# Patient Record
Sex: Male | Born: 1965
Health system: Southern US, Community
[De-identification: ages and names within clinical notes are randomized; demographics above are authoritative.]

## PROBLEM LIST (undated history)

## (undated) DIAGNOSIS — J302 Other seasonal allergic rhinitis: Secondary | ICD-10-CM

## (undated) DIAGNOSIS — R519 Headache, unspecified: Secondary | ICD-10-CM

## (undated) DIAGNOSIS — I10 Essential (primary) hypertension: Secondary | ICD-10-CM

## (undated) DIAGNOSIS — R51 Headache: Secondary | ICD-10-CM

## (undated) DIAGNOSIS — K222 Esophageal obstruction: Secondary | ICD-10-CM

## (undated) DIAGNOSIS — K219 Gastro-esophageal reflux disease without esophagitis: Secondary | ICD-10-CM

## (undated) DIAGNOSIS — Z8719 Personal history of other diseases of the digestive system: Secondary | ICD-10-CM

## (undated) DIAGNOSIS — E781 Pure hyperglyceridemia: Secondary | ICD-10-CM

## (undated) HISTORY — PX: KNEE ARTHROSCOPY: SHX127

## (undated) HISTORY — PX: UPPER GI ENDOSCOPY: SHX6162

---

## 2012-08-09 ENCOUNTER — Ambulatory Visit (HOSPITAL_COMMUNITY)
Admission: RE | Admit: 2012-08-09 | Discharge: 2012-08-09 | Disposition: A | Discharge status: Home / Self Care | Payer: 59 | Source: Ambulatory Visit | Attending: Internal Medicine | Admitting: Internal Medicine

## 2012-08-09 ENCOUNTER — Encounter (HOSPITAL_COMMUNITY): Payer: Self-pay

## 2012-08-09 VITALS — BP 164/98 | HR 74 | Ht 76.0 in | Wt 286.0 lb

## 2012-08-09 DIAGNOSIS — I10 Essential (primary) hypertension: Secondary | ICD-10-CM | POA: Insufficient documentation

## 2012-08-09 HISTORY — DX: Essential (primary) hypertension: I10

## 2012-08-09 HISTORY — DX: Gastro-esophageal reflux disease without esophagitis: K21.9

## 2012-08-09 HISTORY — DX: Esophageal obstruction: K22.2

## 2012-08-09 HISTORY — DX: Pure hyperglyceridemia: E78.1

## 2012-08-09 LAB — COMPREHENSIVE METABOLIC PANEL
ALT: 22 U/L (ref 0–53)
CO2: 28 mEq/L (ref 19–32)
Calcium: 9.8 mg/dL (ref 8.4–10.5)
Creatinine, Ser: 1.3 mg/dL (ref 0.50–1.35)
GFR calc Af Amer: 74 mL/min — ABNORMAL LOW (ref >90)
GFR calc non Af Amer: 64 mL/min — ABNORMAL LOW (ref >90)
Glucose, Bld: 96 mg/dL (ref 70–99)

## 2012-08-09 MED ORDER — DOXAZOSIN MESYLATE 2 MG PO TABS: 2.0000 mg | ORAL_TABLET | Freq: Every day | ORAL | Status: DC

## 2012-08-09 MED ORDER — CLONIDINE HCL 0.2 MG PO TABS: 0.1000 mg | ORAL_TABLET | Freq: Two times a day (BID) | ORAL | Status: DC

## 2012-08-09 NOTE — Patient Instructions (Addendum)
Follow up in 2 months.  Cut clonidine back to 0.1 mg.  Start cardura 0.2 mg daily at [facility].  Will call with lab results and will set up stress test.

## 2012-08-09 NOTE — Progress Notes (Signed)
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 Presents for consultation regarding his HTN.   First diagnosed with HTN in college when he went to donate blood. Started therapy when he was 28. Was in amlodipine trial. But had severe edema and had to stop.  Family history is not notable for severe HTN. Has been on multiple medications recently (see below)   Does not exercise routinely but remains very active. No CP or SOB.   Recently BPs running 120-145/80s. However he gets very fatigued; especially with clonidine. Only able to get HR into 80s with activity. Only snores when he is post-call and up for a long time. Otherwise, no snoring or other signs of OSA.   Has never had stress test or renal u/s. Cr 1.1-1.4    Review of Systems: [y] = yes, [ ]  = no   General: Weight gain [ ] ; Weight loss [ ] ; Anorexia [ ] ; Fatigue [ y]; Fever [ ] ; Chills [ ] ; Weakness [ ]   Cardiac: Chest pain/pressure [ ] ; Resting SOB [ ] ; Exertional SOB [ ] ; Orthopnea [ ] ; Pedal Edema [ ] ; Palpitations [ ] ; Syncope [ ] ; Presyncope [ ] ; Paroxysmal nocturnal dyspnea[ ]   Pulmonary: Cough [ ] ; Wheezing[ ] ; Hemoptysis[ ] ; Sputum [ ] ; Snoring [ ]   GI: Vomiting[ ] ; Dysphagia[ ] ; Melena[ ] ; Hematochezia [ ] ; Heartburn[ ] ; Abdominal pain [ ] ; Constipation [ ] ; Diarrhea [ ] ; BRBPR [ ]   GU: Hematuria[ ] ; Dysuria [ ] ; Nocturia[ ]   Vascular: Pain in legs with walking [ ] ; Pain in feet with lying flat [ ] ; Non-healing sores [ ] ; Stroke [ ] ; TIA [ ] ; Slurred speech [ ] ;  Neuro: Headaches[ ] ; Vertigo[ ] ; Seizures[ ] ; Paresthesias[ ] ;Blurred vision [ ] ; Diplopia [ ] ; Vision changes [ ]   Ortho/Skin: Arthritis [ ] ; Joint pain [ ] ; Muscle pain [ ] ; Joint swelling [ ] ; Back Pain [ ] ; Rash [ ]   Psych: Depression[ ] ; Anxiety[ ]   Heme: Bleeding problems [ ] ; Clotting disorders [ ] ;  Anemia [ ]   Endocrine: Diabetes [ ] ; Thyroid dysfunction[ ]   Home Medications  Past Medical History: Past Medical History  Diagnosis Date  . Hypertension   . Schatzki's ring   . Hypertriglyceridemia   . GERD (gastroesophageal reflux disease)    Family History:  Dad: Type II DM, diet controlled. Mild HTN Mom: Deceased. Cerebral aneursym  Social History: History   Social History  . Marital Status: Married    Spouse Name: N/A    Number of Children: N/A  . Years of Education: N/A   Social History Main Topics  . Smoking status: Never Smoker   . Smokeless tobacco: Not on file  . Alcohol Use: Not on file  . Drug Use: Not on file  . Sexually Active: Not on file   Other Topics Concern  . Not on file   Social History Narrative  . No narrative on file    Allergies:  Allergies  Allergen Reactions  . Lopid (Gemfibrozil) Diarrhea  . Norvasc (Amlodipine Besylate) Swelling    Objective:    Vital Signs:   Pulse Rate:  [74] 74 (02/20 1023) BP: (164)/(98) 164/98 mmHg (02/20 1023) SpO2:  [96 %] 96 % (02/20 1023) Weight:  [286 lb (129.729 kg)] 286 lb (129.729 kg) (02/20 1023)    Weight change:  Filed Weights   08/09/12 1023  Weight: 286 lb (129.729 kg)     Physical Exam: General:  Well appearing. No resp difficulty HEENT: normal Neck: supple. JVP . Carotids 2+ bilat; no bruits. No lymphadenopathy or thryomegaly appreciated. Cor: PMI nondisplaced. Regular rate & rhythm. No rubs, gallops or murmurs. Lungs: clear Abdomen: soft, nontender, nondistended. No hepatosplenomegaly. No bruits or masses. Good bowel sounds. Extremities: no cyanosis, clubbing, rash, edema Neuro: alert & orientedx3, cranial nerves grossly intact. moves all 4 extremities w/o difficulty. Affect pleasant    ASSESSMENT & PLAN

## 2012-08-14 ENCOUNTER — Telehealth (HOSPITAL_COMMUNITY): Payer: Self-pay | Admitting: Anesthesiology

## 2012-08-14 MED ORDER — POTASSIUM CHLORIDE CRYS ER 20 MEQ PO TBCR: 40.0000 meq | EXTENDED_RELEASE_TABLET | Freq: Two times a day (BID) | ORAL | Status: DC

## 2012-08-14 NOTE — Telephone Encounter (Signed)
Called patient and clarified K+ dose. Asked patient to change to BID.

## 2012-08-15 LAB — ALDOSTERONE + RENIN ACTIVITY W/ RATIO
ALDO / PRA Ratio: 27.3 Ratio (ref 0.9–28.9)
Aldosterone: 3 ng/dL
PRA LC/MS/MS: 0.11 ng/mL/h — ABNORMAL LOW (ref 0.25–5.82)

## 2012-08-19 DIAGNOSIS — I10 Essential (primary) hypertension: Secondary | ICD-10-CM | POA: Insufficient documentation

## 2012-08-19 NOTE — Assessment & Plan Note (Signed)
I suspect he has severe essential HTN. I do not see any clear secondary causes. He does not appear to have OSA. Will check some basic labs including PRA activity. Given fatigue will cut clonidine back to 0.1 mg bid.  Start cardura 0.2 mg daily at [facility]. Will titrate as needed. Needs stress test for routine screening given risk factors.

## 2012-08-31 ENCOUNTER — Ambulatory Visit (INDEPENDENT_AMBULATORY_CARE_PROVIDER_SITE_OTHER): Payer: 59 | Admitting: Physician Assistant

## 2012-08-31 DIAGNOSIS — I10 Essential (primary) hypertension: Secondary | ICD-10-CM

## 2012-08-31 NOTE — Procedures (Signed)
Exercise Treadmill Test  Pre-Exercise Testing Evaluation Rhythm: normal sinus  Rate: 55     Test  Exercise Tolerance Test Ordering MD: Arvilla Meres, MD  Interpreting MD: Tereso Newcomer PA-C  Unique Test No: 1  Treadmill:  1  Indication for ETT: chest pain - rule out ischemia  Contraindication to ETT: No   Stress Modality: exercise - treadmill  Cardiac Imaging Performed: non   Protocol: standard Bruce - maximal  Max BP:  210/97  Max MPHR (bpm):  172 85% MPR (bpm):  146  MPHR obtained (bpm):  155 % MPHR obtained:  90%  Reached 85% MPHR (min:sec):  7:50 Total Exercise Time (min-sec):  9:01  Workload in METS:  10.6 Borg Scale: 18  Reason ETT Terminated:  patient's desire to stop    ST Segment Analysis At Rest: normal ST segments - no evidence of significant ST depression With Exercise: non-specific ST changes  Other Information Arrhythmia:  No Angina during ETT:  absent (0) Quality of ETT:  diagnostic  ETT Interpretation:  normal - no evidence of ischemia by ST analysis  Comments: Good exercise tolerance. No chest pain. Normal BP response to exercise. Insignificant up-sloping ST depression noted at max exercise.     Recommendations: Follow up with Dr. Arvilla Meres as directed. Luna Glasgow, PA-C  8:55 AM 08/31/2012

## 2012-10-15 ENCOUNTER — Ambulatory Visit (HOSPITAL_COMMUNITY)
Admission: RE | Admit: 2012-10-15 | Discharge: 2012-10-15 | Disposition: A | Discharge status: Home / Self Care | Payer: 59 | Source: Ambulatory Visit | Attending: Internal Medicine | Admitting: Internal Medicine

## 2012-10-15 ENCOUNTER — Encounter (HOSPITAL_COMMUNITY): Payer: Self-pay

## 2012-10-15 VITALS — BP 162/94 | HR 76 | Wt 287.1 lb

## 2012-10-15 DIAGNOSIS — I10 Essential (primary) hypertension: Secondary | ICD-10-CM | POA: Insufficient documentation

## 2012-10-15 MED ORDER — DOXAZOSIN MESYLATE 2 MG PO TABS: 8.0000 mg | ORAL_TABLET | Freq: Every day | ORAL | Status: DC

## 2012-10-15 NOTE — Patient Instructions (Addendum)
Increase cardura 6 mg daily.  Would like BP less than 140/90.  If blood pressure remains high would stop lisinopril/HCTZ and potassium and start spironolactone 50 mg daily.   Follow up 3 months with Dr. Gala Romney

## 2012-10-15 NOTE — Progress Notes (Signed)
  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   First diagnosed with HTN in college when he went to donate blood. Started therapy when he was 28. Was in amlodipine trial. But had severe edema and had to stop. Family history is not notable for severe HTN. Has been on multiple medications recently (see below)   08/31/12: Stress test normal  Cr 1.3 LDL 102  Returns for follow up today.  Feels well.  BPs 140-160/60-90.  No chest pain.  No shortness of breath.  Clonidine stopped due to fatigue, feels much better.  Not exercising.     Review of Systems: All pertinent positives and negatives as in HPI, otherwise negative  Home Medications Current Outpatient Prescriptions on File Prior to Encounter  Medication Sig Dispense Refill  . aspirin 81 MG tablet Take 81 mg by mouth daily.      . carvedilol (COREG) 25 MG tablet Take 25 mg by mouth 2 (two) times daily with a meal.      . esomeprazole (NEXIUM) 40 MG capsule Take 40 mg by mouth daily before breakfast.      . fenofibrate 160 MG tablet Take 160 mg by mouth daily.      . hydrochlorothiazide (HYDRODIURIL) 25 MG tablet Take 25 mg by mouth daily.      Marland Kitchen lisinopril-hydrochlorothiazide (PRINZIDE,ZESTORETIC) 20-25 MG per tablet Take 1 tablet by mouth daily.      . Multiple Vitamin (MULTIVITAMIN) tablet Take 1 tablet by mouth daily.      . potassium chloride SA (K-DUR,KLOR-CON) 20 MEQ tablet Take 2 tablets (40 mEq total) by mouth 2 (two) times daily.  60 tablet  3  . valsartan (DIOVAN) 320 MG tablet Take 320 mg by mouth daily.       No current facility-administered medications on file prior to encounter.  Cardura 6 mg daily.   Past Medical History: Past Medical History  Diagnosis Date  . Hypertension   . Schatzki's ring   . Hypertriglyceridemia   . GERD (gastroesophageal reflux  disease)     Objective:    Vital Signs:  Filed Vitals:   10/15/12 0900  BP: 162/94  Pulse: 76  Weight: 287 lb 1.9 oz (130.237 kg)  SpO2: 97%     Physical Exam: General:  Well appearing. No resp difficulty HEENT: normal Neck: supple. JVP 5-6. Carotids 2+ bilat; no bruits. No lymphadenopathy or thryomegaly appreciated. Cor: PMI nondisplaced. Regular rate & rhythm. No rubs, gallops or murmurs. Lungs: clear Abdomen: soft, nontender, nondistended. No hepatosplenomegaly. No bruits or masses. Good bowel sounds. Extremities: no cyanosis, clubbing, rash, tr edema Neuro: alert & orientedx3, cranial nerves grossly intact. moves all 4 extremities w/o difficulty. Affect pleasant    ASSESSMENT & PLAN

## 2012-10-15 NOTE — Assessment & Plan Note (Addendum)
Blood pressure remains elevated.  Will increase cardura 8 mg daily.  Can use clonidine prn for SBP >150s.  Would like SBP<140, if not being controlled with increased cardura will plan to stop lisinopril/HCT and potassium and start spironolactone.  He will call next week with BP readings.  Patient seen and examined with Ulyess Blossom, PA-C. We discussed all aspects of the encounter. I agree with the assessment and plan as stated above. Agree with changes in BP meds. I would like to switch him from lisinopril/HCTZ to spiro at next visit. He will follow BPs at home and let me know how he is doing. Fatigue much improved off clonidine.

## 2012-10-16 ENCOUNTER — Other Ambulatory Visit (HOSPITAL_COMMUNITY): Payer: Self-pay | Admitting: *Deleted

## 2012-10-16 MED ORDER — POTASSIUM CHLORIDE CRYS ER 20 MEQ PO TBCR: 40.0000 meq | EXTENDED_RELEASE_TABLET | Freq: Two times a day (BID) | ORAL | Status: DC

## 2012-11-18 ENCOUNTER — Other Ambulatory Visit (HOSPITAL_COMMUNITY): Payer: Self-pay | Admitting: Physician Assistant

## 2012-11-19 MED ORDER — DOXAZOSIN MESYLATE 2 MG PO TABS: 8.0000 mg | ORAL_TABLET | Freq: Every day | ORAL | Status: DC

## 2013-01-01 ENCOUNTER — Ambulatory Visit (HOSPITAL_COMMUNITY): Payer: 59

## 2013-01-07 ENCOUNTER — Other Ambulatory Visit (HOSPITAL_COMMUNITY): Payer: Self-pay | Admitting: *Deleted

## 2013-01-07 MED ORDER — DOXAZOSIN MESYLATE 8 MG PO TABS: 8.0000 mg | ORAL_TABLET | Freq: Every day | ORAL | Status: DC

## 2013-01-07 MED ORDER — SPIRONOLACTONE 50 MG PO TABS: 50.0000 mg | ORAL_TABLET | Freq: Every day | ORAL | Status: DC

## 2013-01-22 ENCOUNTER — Ambulatory Visit (HOSPITAL_COMMUNITY)
Admission: RE | Admit: 2013-01-22 | Discharge: 2013-01-22 | Disposition: A | Discharge status: Home / Self Care | Payer: 59 | Source: Ambulatory Visit | Attending: Internal Medicine | Admitting: Internal Medicine

## 2013-01-22 ENCOUNTER — Encounter (HOSPITAL_COMMUNITY): Payer: Self-pay

## 2013-01-22 VITALS — BP 160/108 | HR 64 | Wt 287.1 lb

## 2013-01-22 DIAGNOSIS — I10 Essential (primary) hypertension: Secondary | ICD-10-CM | POA: Insufficient documentation

## 2013-01-22 DIAGNOSIS — E785 Hyperlipidemia, unspecified: Secondary | ICD-10-CM | POA: Insufficient documentation

## 2013-01-22 LAB — BASIC METABOLIC PANEL
BUN: 24 mg/dL — ABNORMAL HIGH (ref 6–23)
CO2: 27 mEq/L (ref 19–32)
Calcium: 9.7 mg/dL (ref 8.4–10.5)
Creatinine, Ser: 1.27 mg/dL (ref 0.50–1.35)

## 2013-01-22 NOTE — Progress Notes (Addendum)
Patient ID: Albert Leach, male   DOB: 10-04-1965, 47 y.o.   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   First diagnosed with HTN in college when he went to donate blood. Started therapy when he was 28. Was in amlodipine trial. But had severe edema and had to stop. Family history is not notable for severe HTN. Has been on multiple medications recently (see below)   08/31/12: Stress test normal  Cr 1.3 LDL 102  Follow up. Last visit increased your Cardura to 8mg  daily, started spironolactone 50 mg daily, stopped HCTZ and potassium. Reports feeling well. Daughter getting ready to go to college. BPs at home 120-140/70's. Not exercising. Has not taken morning medications and just came from HR.   Review of Systems: All pertinent positives and negatives as in HPI, otherwise negative  Home Medications Current Outpatient Prescriptions on File Prior to Encounter  Medication Sig Dispense Refill  . aspirin 81 MG tablet Take 81 mg by mouth daily.      . carvedilol (COREG) 25 MG tablet Take 25 mg by mouth 2 (two) times daily with a meal.      . doxazosin (CARDURA) 8 MG tablet Take 1 tablet (8 mg total) by mouth at bedtime.  90 tablet  3  . esomeprazole (NEXIUM) 40 MG capsule Take 40 mg by mouth daily before breakfast.      . fenofibrate 160 MG tablet Take 160 mg by mouth daily.      . Multiple Vitamin (MULTIVITAMIN) tablet Take 1 tablet by mouth daily.      Marland Kitchen spironolactone (ALDACTONE) 50 MG tablet Take 1 tablet (50 mg total) by mouth daily.  90 tablet  3  . valsartan (DIOVAN) 320 MG tablet Take 320 mg by mouth daily.       No current facility-administered medications on file prior to encounter.   Past Medical History: Past Medical History  Diagnosis Date  . Hypertension   . Schatzki's ring   .  Hypertriglyceridemia   . GERD (gastroesophageal reflux disease)     Objective:    Vital Signs:  Filed Vitals:   01/22/13 0905  BP: 160/108  Pulse: 64  Weight: 287 lb 1.9 oz (130.237 kg)  SpO2: 98%   Physical Exam: General:  Well appearing. No resp difficulty HEENT: normal Neck: supple. JVP 5-6. Carotids 2+ bilat; no bruits. No lymphadenopathy or thryomegaly appreciated. Cor: PMI nondisplaced. Regular rate & rhythm. No rubs, gallops or murmurs. Lungs: clear Abdomen: soft, nontender, nondistended. No hepatosplenomegaly. No bruits or masses. Good bowel sounds. Extremities: no cyanosis, clubbing, rash, tr edema Neuro: alert & orientedx3, cranial nerves grossly intact. moves all 4 extremities w/o difficulty. Affect pleasant   ASSESSMENT & PLAN  1) HTN - BP elevated this am, however did not take am doses of medications - Will continue BB, ARB, Spiro, and cardura - Continue to monitor BP and if remains elevated call back to clinic - BMET today, stable - F/U 6 months  2) Hyperlipidemia - Managed by Dr. Reather Leach - Continue ASA and fenofibrate  Albert Leach B NP-C 11:32 AM

## 2013-01-22 NOTE — Patient Instructions (Addendum)
Doing great.  Will get BMET today.  Call if notice any increase in BP or notice that it is no longer controlled.  F/U 6 months

## 2013-04-25 ENCOUNTER — Other Ambulatory Visit: Payer: Self-pay

## 2013-08-29 ENCOUNTER — Telehealth (HOSPITAL_COMMUNITY): Payer: Self-pay | Admitting: Cardiology

## 2013-08-29 ENCOUNTER — Encounter (HOSPITAL_COMMUNITY): Payer: Self-pay | Admitting: Cardiology

## 2013-08-29 NOTE — Telephone Encounter (Signed)
Attempting to schedule 6 month follow up I have been unable to reach this patient by phone.  A letter is being sent to the last known home address.

## 2013-09-23 ENCOUNTER — Ambulatory Visit (HOSPITAL_COMMUNITY)
Admission: RE | Admit: 2013-09-23 | Discharge: 2013-09-23 | Disposition: A | Discharge status: Home / Self Care | Payer: 59 | Source: Ambulatory Visit | Attending: Internal Medicine | Admitting: Internal Medicine

## 2013-09-23 VITALS — BP 148/88 | HR 66 | Wt 291.5 lb

## 2013-09-23 DIAGNOSIS — I10 Essential (primary) hypertension: Secondary | ICD-10-CM | POA: Insufficient documentation

## 2013-09-23 MED ORDER — DOXAZOSIN MESYLATE 8 MG PO TABS: ORAL_TABLET | ORAL | Status: DC

## 2013-09-23 NOTE — Addendum Note (Signed)
Encounter addended by: Scarlette Calico, RN on: 09/23/2013  8:56 AM<BR>     Documentation filed: Patient Instructions Section, Orders

## 2013-09-23 NOTE — Progress Notes (Signed)
Patient ID: Albert Leach, male   DOB: Aug 24, 1965, 48 y.o.   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   First diagnosed with HTN in college when he went to donate blood. Started therapy when he was 63. Was in amlodipine trial but had severe edema and had to stop. Family history is not notable for severe HTN. Has been on multiple medications recently (see below)   08/31/12: Stress test normal  Cr 1.3 LDL 102  Follow up. Last visit no changes. Doing well. Has f/u with PCP for labs and yearly physical later this month. SBP at thome 126-160/70-90s. Systolics average about 989. Several diastolics in the 21J. Denies CP or SOB. Not exercising but relatively active. No CP/SOB. Took Coreg this am, but not other meds just got off work.  Review of Systems: All pertinent positives and negatives as in HPI, otherwise negative  Home Medications Current Outpatient Prescriptions on File Prior to Encounter  Medication Sig Dispense Refill  . aspirin 81 MG tablet Take 81 mg by mouth daily.      . carvedilol (COREG) 25 MG tablet Take 25 mg by mouth 2 (two) times daily with a meal.      . doxazosin (CARDURA) 8 MG tablet Take 1 tablet (8 mg total) by mouth at bedtime.  90 tablet  3  . fenofibrate 160 MG tablet Take 160 mg by mouth daily.      . Multiple Vitamin (MULTIVITAMIN) tablet Take 1 tablet by mouth daily.      Marland Kitchen spironolactone (ALDACTONE) 50 MG tablet Take 1 tablet (50 mg total) by mouth daily.  90 tablet  3  . valsartan (DIOVAN) 320 MG tablet Take 320 mg by mouth daily.       No current facility-administered medications on file prior to encounter.   Past Medical History: Past Medical History  Diagnosis Date  . Hypertension   . Schatzki's ring   . Hypertriglyceridemia   . GERD (gastroesophageal reflux disease)      Objective:    Vital Signs:  Filed Vitals:   09/23/13 0810  BP: 148/88  Pulse: 66  Weight: 291 lb 8 oz (132.224 kg)  SpO2: 96%   Physical Exam: General:  Well appearing. No resp difficulty HEENT: normal Neck: supple. JVP 5-6. Carotids 2+ bilat; no bruits. No lymphadenopathy or thryomegaly appreciated. Cor: PMI nondisplaced. Regular rate & rhythm. No rubs, gallops or murmurs. Lungs: clear Abdomen: soft, nontender, nondistended. No hepatosplenomegaly. No bruits or masses. Good bowel sounds. Extremities: no cyanosis, clubbing, rash, tr edema Neuro: alert & orientedx3, cranial nerves grossly intact. moves all 4 extremities w/o difficulty. Affect pleasant   ASSESSMENT & PLAN  1) HTN - BP better but still a bit too high.  - Will continue BB, ARB, Spiro, and cardura - Increase Cardura to 4am and 8pm - Continue to monitor BP and if remains elevated call back to clinic - BMET today, stable - F/U 6 months  2) Hyperlipidemia - Managed by Dr. Edson Snowball - Continue ASA and fenofibrate  Junie Bame B NP-C 8:16 AM  Patient seen and examined with Junie Bame, NP. We discussed all aspects of the encounter. I agree with the assessment and plan as stated above. Will increase Cardura and see if he tolerates. Can consider 24-hour BP monitor if needed. Stressed need  to keep weight down.   Albert Carias,MD 8:53 AM

## 2013-09-23 NOTE — Patient Instructions (Signed)
Increase Cardura to 4 mg (1/2 tab) in AM and 8 mg (1 tab) in PM  We will contact you in 6 months to schedule your next appointment.

## 2013-12-27 ENCOUNTER — Other Ambulatory Visit (HOSPITAL_COMMUNITY): Payer: Self-pay | Admitting: Internal Medicine

## 2013-12-27 DIAGNOSIS — I509 Heart failure, unspecified: Secondary | ICD-10-CM

## 2014-09-23 ENCOUNTER — Other Ambulatory Visit (HOSPITAL_COMMUNITY): Payer: Self-pay | Admitting: *Deleted

## 2014-09-29 ENCOUNTER — Other Ambulatory Visit (HOSPITAL_COMMUNITY): Payer: Self-pay | Admitting: Internal Medicine

## 2014-10-21 ENCOUNTER — Ambulatory Visit (HOSPITAL_COMMUNITY)
Admission: RE | Admit: 2014-10-21 | Discharge: 2014-10-21 | Disposition: A | Discharge status: Home / Self Care | Payer: 59 | Source: Ambulatory Visit | Attending: Internal Medicine | Admitting: Internal Medicine

## 2014-10-21 ENCOUNTER — Encounter (HOSPITAL_COMMUNITY): Payer: Self-pay

## 2014-10-21 VITALS — BP 114/64 | HR 62 | Wt 298.5 lb

## 2014-10-21 DIAGNOSIS — K219 Gastro-esophageal reflux disease without esophagitis: Secondary | ICD-10-CM | POA: Diagnosis not present

## 2014-10-21 DIAGNOSIS — E781 Pure hyperglyceridemia: Secondary | ICD-10-CM | POA: Diagnosis not present

## 2014-10-21 DIAGNOSIS — E785 Hyperlipidemia, unspecified: Secondary | ICD-10-CM

## 2014-10-21 DIAGNOSIS — Z7982 Long term (current) use of aspirin: Secondary | ICD-10-CM | POA: Diagnosis not present

## 2014-10-21 DIAGNOSIS — I10 Essential (primary) hypertension: Secondary | ICD-10-CM

## 2014-10-21 NOTE — Progress Notes (Signed)
Patient ID: MICHALL NOFFKE, male   DOB: 02/29/1966, 49 y.o.   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   First diagnosed with HTN in college when he went to donate blood. Started therapy when he was 43. Was in amlodipine trial but had severe edema and had to stop. Family history is not notable for severe HTN. Has been on multiple medications recently (see below)   Follow up. Last visit cardura was increased. Working outside a lot. Denies SOB. SBP 120-140s. Better controlled. Continue to work nights for [employer]. .  08/31/12: Stress test normal   LABS  10/06/2014 K 4.0 Creatinine 1.2 LDL 95 HDL 52 TGL 96.   Review of Systems: All pertinent positives and negatives as in HPI, otherwise negative  Home Medications Current Outpatient Prescriptions on File Prior to Encounter  Medication Sig Dispense Refill  . aspirin 81 MG tablet Take 81 mg by mouth daily.    . carvedilol (COREG) 25 MG tablet Take 25 mg by mouth 2 (two) times daily with a meal.    . doxazosin (CARDURA) 8 MG tablet Take 1/2 tab in AM and 1 tab in PM 135 tablet 3  . fenofibrate 160 MG tablet Take 160 mg by mouth daily.    . Multiple Vitamin (MULTIVITAMIN) tablet Take 1 tablet by mouth daily.    . pantoprazole (PROTONIX) 40 MG tablet Take 40 mg by mouth daily.    Marland Kitchen spironolactone (ALDACTONE) 50 MG tablet TAKE 1 TABLET BY MOUTH DAILY 90 tablet 3  . valsartan (DIOVAN) 320 MG tablet Take 320 mg by mouth daily.     No current facility-administered medications on file prior to encounter.   Past Medical History: Past Medical History  Diagnosis Date  . Hypertension   . Schatzki's ring   . Hypertriglyceridemia   . GERD (gastroesophageal reflux disease)     Objective:    Vital Signs:  Filed Vitals:   10/21/14 1017  BP: 114/64  Pulse: 62  Weight: 298  lb 8 oz (135.399 kg)  SpO2: 98%   Physical Exam: General:  Well appearing. No resp difficulty HEENT: normal Neck: supple. JVP 5-6. Carotids 2+ bilat; no bruits. No lymphadenopathy or thryomegaly appreciated. Cor: PMI nondisplaced. Regular rate & rhythm. No rubs, gallops or murmurs. Lungs: clear Abdomen: soft, nontender, nondistended. No hepatosplenomegaly. No bruits or masses. Good bowel sounds. Extremities: no cyanosis, clubbing, rash, tr edema Neuro: alert & orientedx3, cranial nerves grossly intact. moves all 4 extremities w/o difficulty. Affect pleasant   ASSESSMENT & PLAN  1) HTN - BP stable and much improved.   - Will continue current dose of BB, ARB, Spiro, and cardura  2) Hyperlipidemia - Managed by Dr. Edson Snowball - Continue ASA and fenofibrate  Follow up in 6 months   Babatunde Seago NP-C 10:23 AM

## 2014-10-21 NOTE — Patient Instructions (Signed)
Doing great!  Follow up 6 months.  Do the following things EVERYDAY: 1) Weigh yourself in the morning before breakfast. Write it down and keep it in a log. 2) Take your medicines as prescribed 3) Eat low salt foods-Limit salt (sodium) to 2000 mg per day.  4) Stay as active as you can everyday 5) Limit all fluids for the day to less than 2 liters

## 2014-12-31 ENCOUNTER — Other Ambulatory Visit (HOSPITAL_COMMUNITY): Payer: Self-pay | Admitting: Cardiology

## 2015-06-26 MED FILL — PANTOPRAZOLE SOD DR 40 MG T: 40 | 90 days supply | Qty: 90 | Fill #3

## 2015-06-26 MED FILL — DOXAZOSIN MESYLATE 8 MG TAB: 8 | 90 days supply | Qty: 135 | Fill #3

## 2015-06-26 MED FILL — CARVEDILOL 25 MG TABLET: 25 | 90 days supply | Qty: 180 | Fill #3

## 2015-06-26 MED FILL — SPIRONOLACTONE 50 MG TABLET: 50 | 90 days supply | Qty: 90 | Fill #2

## 2015-06-26 MED FILL — VALSARTAN 320 MG TABLET: 320 | 90 days supply | Qty: 90 | Fill #3

## 2015-06-26 MED FILL — FENOFIBRATE 160 MG TABLET: 160 | 90 days supply | Qty: 90 | Fill #2

## 2015-09-30 ENCOUNTER — Other Ambulatory Visit: Payer: Self-pay | Admitting: Internal Medicine

## 2015-09-30 MED FILL — VALSARTAN 320 MG TABLET: 320 | 90 days supply | Qty: 90 | Fill #0

## 2015-09-30 MED FILL — DOXAZOSIN MESYLATE 8 MG TAB: 8 | 90 days supply | Qty: 135 | Fill #0

## 2015-09-30 MED FILL — PANTOPRAZOLE SOD DR 40 MG T: 40 | 90 days supply | Qty: 90 | Fill #0

## 2015-09-30 MED FILL — FENOFIBRATE 160 MG TABLET: 160 | 90 days supply | Qty: 90 | Fill #3

## 2015-09-30 MED FILL — CARVEDILOL 25 MG TABLET: 25 | 90 days supply | Qty: 180 | Fill #0

## 2015-09-30 MED FILL — SPIRONOLACTONE 50 MG TABLET: 50 | 90 days supply | Qty: 90 | Fill #3

## 2015-10-19 DIAGNOSIS — Z125 Encounter for screening for malignant neoplasm of prostate: Secondary | ICD-10-CM | POA: Diagnosis not present

## 2015-10-19 DIAGNOSIS — Z0000 Encounter for general adult medical examination without abnormal findings: Secondary | ICD-10-CM | POA: Diagnosis not present

## 2015-10-20 DIAGNOSIS — E785 Hyperlipidemia, unspecified: Secondary | ICD-10-CM | POA: Diagnosis not present

## 2015-10-20 DIAGNOSIS — Z0000 Encounter for general adult medical examination without abnormal findings: Secondary | ICD-10-CM | POA: Diagnosis not present

## 2015-10-20 DIAGNOSIS — I129 Hypertensive chronic kidney disease with stage 1 through stage 4 chronic kidney disease, or unspecified chronic kidney disease: Secondary | ICD-10-CM | POA: Diagnosis not present

## 2015-12-30 ENCOUNTER — Other Ambulatory Visit (HOSPITAL_COMMUNITY): Payer: Self-pay | Admitting: Cardiology

## 2015-12-30 MED FILL — SPIRONOLACTONE 50 MG TABLET: 50 | 90 days supply | Qty: 90 | Fill #0

## 2015-12-30 MED FILL — DOXAZOSIN MESYLATE 8 MG TAB: 8 | 90 days supply | Qty: 135 | Fill #1

## 2016-01-01 MED FILL — VALSARTAN 320 MG TABLET: 320 | 90 days supply | Qty: 90 | Fill #0

## 2016-01-01 MED FILL — CARVEDILOL 25 MG TABLET: 25 | 90 days supply | Qty: 180 | Fill #0

## 2016-01-01 MED FILL — PANTOPRAZOLE SOD DR 40 MG T: 40 | 90 days supply | Qty: 90 | Fill #0

## 2016-01-01 MED FILL — FENOFIBRATE 160 MG TABLET: 160 | 90 days supply | Qty: 90 | Fill #0

## 2016-01-19 DIAGNOSIS — Z1211 Encounter for screening for malignant neoplasm of colon: Secondary | ICD-10-CM | POA: Diagnosis not present

## 2016-01-21 DIAGNOSIS — D649 Anemia, unspecified: Secondary | ICD-10-CM | POA: Diagnosis not present

## 2016-01-21 DIAGNOSIS — R195 Other fecal abnormalities: Secondary | ICD-10-CM | POA: Diagnosis not present

## 2016-01-21 DIAGNOSIS — Z8371 Family history of colonic polyps: Secondary | ICD-10-CM | POA: Diagnosis not present

## 2016-01-21 DIAGNOSIS — K219 Gastro-esophageal reflux disease without esophagitis: Secondary | ICD-10-CM | POA: Diagnosis not present

## 2016-02-08 MED FILL — GAVILYTE-N SOLUTION: 420 | 1 days supply | Qty: 4000 | Fill #0

## 2016-02-16 ENCOUNTER — Other Ambulatory Visit: Payer: Self-pay | Admitting: Gastroenterology

## 2016-02-18 ENCOUNTER — Encounter (HOSPITAL_COMMUNITY): Payer: Self-pay | Admitting: *Deleted

## 2016-02-24 ENCOUNTER — Encounter (HOSPITAL_COMMUNITY)
Admission: RE | Disposition: A | Discharge status: Home / Self Care | Payer: Self-pay | Source: Ambulatory Visit | Attending: Gastroenterology

## 2016-02-24 ENCOUNTER — Ambulatory Visit (HOSPITAL_COMMUNITY)
Admission: RE | Admit: 2016-02-24 | Discharge: 2016-02-24 | Disposition: A | Discharge status: Home / Self Care | Payer: 59 | Source: Ambulatory Visit | Attending: Gastroenterology | Admitting: Gastroenterology

## 2016-02-24 ENCOUNTER — Ambulatory Visit (HOSPITAL_COMMUNITY): Payer: 59 | Admitting: Anesthesiology

## 2016-02-24 ENCOUNTER — Encounter (HOSPITAL_COMMUNITY): Payer: Self-pay | Admitting: *Deleted

## 2016-02-24 DIAGNOSIS — K635 Polyp of colon: Secondary | ICD-10-CM | POA: Insufficient documentation

## 2016-02-24 DIAGNOSIS — D649 Anemia, unspecified: Secondary | ICD-10-CM | POA: Diagnosis not present

## 2016-02-24 DIAGNOSIS — Z6836 Body mass index (BMI) 36.0-36.9, adult: Secondary | ICD-10-CM | POA: Diagnosis not present

## 2016-02-24 DIAGNOSIS — K449 Diaphragmatic hernia without obstruction or gangrene: Secondary | ICD-10-CM | POA: Diagnosis not present

## 2016-02-24 DIAGNOSIS — Z8371 Family history of colonic polyps: Secondary | ICD-10-CM | POA: Insufficient documentation

## 2016-02-24 DIAGNOSIS — Z7982 Long term (current) use of aspirin: Secondary | ICD-10-CM | POA: Insufficient documentation

## 2016-02-24 DIAGNOSIS — E669 Obesity, unspecified: Secondary | ICD-10-CM | POA: Insufficient documentation

## 2016-02-24 DIAGNOSIS — D123 Benign neoplasm of transverse colon: Secondary | ICD-10-CM | POA: Diagnosis not present

## 2016-02-24 DIAGNOSIS — K644 Residual hemorrhoidal skin tags: Secondary | ICD-10-CM | POA: Insufficient documentation

## 2016-02-24 DIAGNOSIS — K317 Polyp of stomach and duodenum: Secondary | ICD-10-CM | POA: Diagnosis not present

## 2016-02-24 DIAGNOSIS — Z79899 Other long term (current) drug therapy: Secondary | ICD-10-CM | POA: Insufficient documentation

## 2016-02-24 DIAGNOSIS — R195 Other fecal abnormalities: Secondary | ICD-10-CM | POA: Diagnosis not present

## 2016-02-24 DIAGNOSIS — E781 Pure hyperglyceridemia: Secondary | ICD-10-CM | POA: Diagnosis not present

## 2016-02-24 DIAGNOSIS — D122 Benign neoplasm of ascending colon: Secondary | ICD-10-CM | POA: Insufficient documentation

## 2016-02-24 DIAGNOSIS — I10 Essential (primary) hypertension: Secondary | ICD-10-CM | POA: Insufficient documentation

## 2016-02-24 DIAGNOSIS — K219 Gastro-esophageal reflux disease without esophagitis: Secondary | ICD-10-CM | POA: Insufficient documentation

## 2016-02-24 DIAGNOSIS — D124 Benign neoplasm of descending colon: Secondary | ICD-10-CM | POA: Diagnosis not present

## 2016-02-24 HISTORY — DX: Other seasonal allergic rhinitis: J30.2

## 2016-02-24 HISTORY — DX: Personal history of other diseases of the digestive system: Z87.19

## 2016-02-24 HISTORY — PX: ESOPHAGOGASTRODUODENOSCOPY (EGD) WITH PROPOFOL: SHX5813

## 2016-02-24 HISTORY — PX: COLONOSCOPY WITH PROPOFOL: SHX5780

## 2016-02-24 HISTORY — DX: Headache: R51

## 2016-02-24 HISTORY — DX: Headache, unspecified: R51.9

## 2016-02-24 SURGERY — ESOPHAGOGASTRODUODENOSCOPY (EGD) WITH PROPOFOL
Anesthesia: Monitor Anesthesia Care

## 2016-02-24 MED ORDER — PROPOFOL 10 MG/ML IV BOLUS
INTRAVENOUS | Status: AC
  Filled 2016-02-24: qty 40

## 2016-02-24 MED ORDER — ASPIRIN 81 MG PO TABS: 81.0000 mg | ORAL_TABLET | Freq: Every day | ORAL | Status: AC

## 2016-02-24 MED ORDER — PROPOFOL 10 MG/ML IV BOLUS
INTRAVENOUS | Status: AC
Start: 2016-02-24 — End: 2016-02-24
  Filled 2016-02-24: qty 20

## 2016-02-24 MED ORDER — PROPOFOL 10 MG/ML IV BOLUS
INTRAVENOUS | Status: DC | PRN
  Administered 2016-02-24 (×7): 40 mg via INTRAVENOUS
  Administered 2016-02-24: 20 mg via INTRAVENOUS
  Administered 2016-02-24 (×4): 40 mg via INTRAVENOUS

## 2016-02-24 MED ORDER — LACTATED RINGERS IV SOLN
INTRAVENOUS | Status: DC
  Administered 2016-02-24: 08:00:00 via INTRAVENOUS
  Administered 2016-02-24: 1000 mL via INTRAVENOUS

## 2016-02-24 MED ORDER — SODIUM CHLORIDE 0.9 % IV SOLN
INTRAVENOUS | Status: DC
  Administered 2016-02-24: 09:00:00 via INTRAVENOUS

## 2016-02-24 SURGICAL SUPPLY — 24 items

## 2016-02-24 NOTE — H&P (Signed)
[facility] Admission Note  Chief Complaint: GERD, anemia, HPS  HPI: Albert Leach is an 50 y.o. male.  Presents for endoscopy and colonoscopy for GERD, hemoccult-positive stools, anemia.  Past Medical History:  Diagnosis Date  . GERD (gastroesophageal reflux disease)   . Headache    headache with hypertension med of "level 4"  . History of hiatal hernia   . Hypertension   . Hypertriglyceridemia   . Schatzki's ring   . Seasonal allergies     Past Surgical History:  Procedure Laterality Date  . KNEE ARTHROSCOPY Left   . UPPER GI ENDOSCOPY      Medications Prior to Admission  Medication Sig Dispense Refill  . aspirin 81 MG tablet Take 81 mg by mouth daily.    . carvedilol (COREG) 25 MG tablet Take 25 mg by mouth 2 (two) times daily with a meal.    . doxazosin (CARDURA) 8 MG tablet TAKE 1/2 TABLET BY MOUTH IN THE MORNING AND 1 TABLET IN THE EVENING 135 tablet 3  . fenofibrate 160 MG tablet Take 160 mg by mouth daily.    . Multiple Vitamin (MULTIVITAMIN) tablet Take 1 tablet by mouth daily.    . pantoprazole (PROTONIX) 40 MG tablet Take 40 mg by mouth daily.    Marland Kitchen spironolactone (ALDACTONE) 50 MG tablet TAKE 1 TABLET BY MOUTH DAILY 90 tablet 3  . valsartan (DIOVAN) 320 MG tablet Take 320 mg by mouth daily.      Allergies:  Allergies  Allergen Reactions  . Prednisone Hypertension    And 20 lbs weight gain in 2 weeks  . Lopid [Gemfibrozil] Diarrhea  . Norvasc [Amlodipine Besylate] Swelling    Lower extremity edema    Family History  Problem Relation Age of Onset  . Cerebral aneurysm Mother   . CAD Father     Social History:  reports that he has never smoked. He has never used smokeless tobacco. He reports that he drinks alcohol. He reports that he does not use drugs.   ROS: as per HPI  Blood pressure (!) 172/92, pulse (!) 57, temperature 98.1 F (36.7 C), temperature source Oral, resp. rate 10, height 6\' 4"  (1.93 m), weight 133.8 kg (295 lb), SpO2  100 %. General appearance:  NAD Head: [place]/AT Eyes: anicteric GI: Soft, non-tender Extremities: no edema Skin: no rash NEURO:  Non-focal  No results found for this or any previous visit (from the past 48 hour(s)). No results found.  Assessment/Plan  1.  GERD. 2.  Anemia. 3.  Hemoccult-positive stools. 4.  Family history of colonic polyps  Plan:  1.  Endoscopy. 2.  Risks (bleeding, infection, bowel perforation that could require surgery, sedation-related changes in cardiopulmonary systems), benefits (identification and possible treatment of source of symptoms, exclusion of certain causes of symptoms), and alternatives (watchful waiting, radiographic imaging studies, empiric medical treatment) of upper endoscopy (EGD) were explained to patient/family in detail and patient wishes to proceed. 3.  Colonoscopy. 4.  Risks (bleeding, infection, bowel perforation that could require surgery, sedation-related changes in cardiopulmonary systems), benefits (identification and possible treatment of source of symptoms, exclusion of certain causes of symptoms), and alternatives (watchful waiting, radiographic imaging studies, empiric medical treatment) of colonoscopy were explained to patient/family in detail and patient wishes to proceed.  Landry Dyke 02/24/2016, 8:19 AM

## 2016-02-24 NOTE — Discharge Instructions (Signed)

## 2016-02-24 NOTE — Op Note (Signed)
[facility] Patient Name: Albert Leach Procedure Date: 02/24/2016 MRN: [id number] Attending MD: Arta Silence , MD Date of Birth: [birth year] CSN: [id number] Age: 50 Admit Type: Outpatient Procedure:                Colonoscopy Indications:              This is the patient's first colonoscopy, Heme                            positive stool, Family history of colonic polyps in                            a first-degree relative Providers:                Arta Silence, MD, Alinda Deem, RN, Elspeth Cho Tech., Technician, Glenis Smoker, CRNA Referring MD:             Blenda Mounts, MD Medicines:                Monitored Anesthesia Care Complications:            No immediate complications. Estimated Blood Loss:     Estimated blood loss was minimal. Procedure:                Pre-Anesthesia Assessment:                           - Prior to the procedure, a History and Physical                            was performed, and patient medications and                            allergies were reviewed. The patient's tolerance of                            previous anesthesia was also reviewed. The risks                            and benefits of the procedure and the sedation                            options and risks were discussed with the patient.                            All questions were answered, and informed consent                            was obtained. Prior Anticoagulants: The patient has                            taken aspirin. ASA Grade Assessment: II - A patient  with mild systemic disease. After reviewing the                            risks and benefits, the patient was deemed in                            satisfactory condition to undergo the procedure.                           - Prior to the procedure, a History and Physical                            was performed, and patient medications and                             allergies were reviewed. The patient's tolerance of                            previous anesthesia was also reviewed. The risks                            and benefits of the procedure and the sedation                            options and risks were discussed with the patient.                            All questions were answered, and informed consent                            was obtained. Prior Anticoagulants: The patient has                            taken aspirin. ASA Grade Assessment: II - A patient                            with mild systemic disease. After reviewing the                            risks and benefits, the patient was deemed in                            satisfactory condition to undergo the procedure.                           After obtaining informed consent, the colonoscope                            was passed under direct vision. Throughout the                            procedure, the patient's blood pressure, pulse, and  oxygen saturations were monitored continuously. The                            [id number]) scope was introduced through                            the anus and advanced to the the cecum, identified                            by appendiceal orifice and ileocecal valve. The                            ileocecal valve, appendiceal orifice, and rectum                            were photographed. The entire colon was examined.                            The colonoscopy was performed without difficulty.                            The patient tolerated the procedure well. The                            quality of the bowel preparation was good. Scope In: 8:44:31 AM Scope Out: 9:03:47 AM Scope Withdrawal Time: 0 hours 15 minutes 58 seconds  Total Procedure Duration: 0 hours 19 minutes 16 seconds  Findings:      The perianal exam findings include non-thrombosed external hemorrhoids.       Many sessile polyps were found in the descending colon, transverse colon       and ascending colon. The polyps were 6 to 8 mm in size. These polyps       were removed with a hot snare. Resection and retrieval were complete.      Colon otherwise normal; no other polyps, masses, vascular ectasias, or       inflammatory changes were seen.      The retroflexed view of the distal rectum and anal verge was normal and       showed no anal or rectal abnormalities. Impression:               - Non-thrombosed external hemorrhoids found on                            perianal exam.                           - Many 6 to 8 mm polyps in the descending colon, in                            the transverse colon and in the ascending colon,                            removed with a hot snare. Resected and retrieved.                           -  The distal rectum and anal verge are normal on                            retroflexion view.                           - The examination was otherwise normal.                            Hemoccult-positive stools and anemia could have                            been either from polyps, hemorrhoids, or both. Moderate Sedation:      None Recommendation:           - Patient has a contact number available for                            emergencies. The signs and symptoms of potential                            delayed complications were discussed with the                            patient. Return to normal activities tomorrow.                            Written discharge instructions were provided to the                            patient.                           - Discharge patient to home (ambulatory).                           - Resume previous diet today.                           - No aspirin, ibuprofen, naproxen, or other                            non-steroidal anti-inflammatory drugs for 3 days                            after polyp removal.                            - Await pathology results.                           - Repeat colonoscopy in 3 - 5 years for                            surveillance based on pathology results.                           -  Return to GI office PRN.                           - Return to referring physician as previously                            scheduled. Procedure Code(s):        --- Professional ---                           [phone], Colonoscopy, flexible; with removal of                            tumor(s), polyp(s), or other lesion(s) by snare                            technique Diagnosis Code(s):        --- Professional ---                           D12.4, Benign neoplasm of descending colon                           D12.3, Benign neoplasm of transverse colon (hepatic                            flexure or splenic flexure)                           D12.2, Benign neoplasm of ascending colon                           K64.4, Residual hemorrhoidal skin tags                           R19.5, Other fecal abnormalities                           Z83.71, Family history of colonic polyps CPT copyright 2016 American Medical Association. All rights reserved. The codes documented in this report are preliminary and upon coder review may  be revised to meet current compliance requirements. Arta Silence, MD 02/24/2016 9:14:01 AM This report has been signed electronically. Number of Addenda: 0

## 2016-02-24 NOTE — Anesthesia Postprocedure Evaluation (Signed)
Anesthesia Post Note  Patient: Albert Leach  Procedure(s) Performed: Procedure(s) (LRB): ESOPHAGOGASTRODUODENOSCOPY (EGD) WITH PROPOFOL (N/A) COLONOSCOPY WITH PROPOFOL (N/A)  Patient location during evaluation: PACU Anesthesia Type: MAC Level of consciousness: awake and alert Pain management: pain level controlled Vital Signs Assessment: post-procedure vital signs reviewed and stable Respiratory status: spontaneous breathing, nonlabored ventilation, respiratory function stable and patient connected to nasal cannula oxygen Cardiovascular status: stable and blood pressure returned to baseline Anesthetic complications: no    Last Vitals:  Vitals:   02/24/16 0920 02/24/16 0930  BP: (!) 106/58 (!) 119/58  Pulse: (!) 49 (!) 51  Resp: 12 15  Temp:      Last Pain:  Vitals:   02/24/16 0911  TempSrc: Oral                 Sybol Morre J

## 2016-02-24 NOTE — Transfer of Care (Signed)
Immediate Anesthesia Transfer of Care Note  Patient: Albert Leach  Procedure(s) Performed: Procedure(s): ESOPHAGOGASTRODUODENOSCOPY (EGD) WITH PROPOFOL (N/A) COLONOSCOPY WITH PROPOFOL (N/A)  Patient Location: PACU  Anesthesia Type:MAC  Level of Consciousness: awake  Airway & Oxygen Therapy: Patient Spontanous Breathing and Patient connected to nasal cannula oxygen  Post-op Assessment: Report given to RN and Post -op Vital signs reviewed and stable  Post vital signs: Reviewed and stable  Last Vitals:  Vitals:   02/24/16 0714  BP: (!) 172/92  Pulse: (!) 57  Resp: 10  Temp: 36.7 C    Last Pain:  Vitals:   02/24/16 0714  TempSrc: Oral         Complications: No apparent anesthesia complications

## 2016-02-24 NOTE — Op Note (Signed)
[facility] Patient Name: Albert Leach Procedure Date: 02/24/2016 MRN: [id number] Attending MD: Arta Silence , MD Date of Birth: [birth year] CSN: [id number] Age: 50 Admit Type: Outpatient Procedure:                Upper GI endoscopy Indications:              Gastro-esophageal reflux disease, Heme positive                            stool Providers:                Arta Silence, MD, Malka So, RN, Elspeth Cho Tech., Technician, Glenis Smoker, CRNA Referring MD:             Blenda Mounts, MD Medicines:                Monitored Anesthesia Care Complications:            No immediate complications. Estimated Blood Loss:     Estimated blood loss was minimal. Procedure:                Pre-Anesthesia Assessment:                           - Prior to the procedure, a History and Physical                            was performed, and patient medications and                            allergies were reviewed. The patient's tolerance of                            previous anesthesia was also reviewed. The risks                            and benefits of the procedure and the sedation                            options and risks were discussed with the patient.                            All questions were answered, and informed consent                            was obtained. Prior Anticoagulants: The patient has                            taken aspirin. ASA Grade Assessment: II - A patient                            with mild systemic disease. After reviewing the  risks and benefits, the patient was deemed in                            satisfactory condition to undergo the procedure.                           After obtaining informed consent, the endoscope was                            passed under direct vision. Throughout the                            procedure, the patient's blood pressure, pulse, and                          oxygen saturations were monitored continuously. The                            [id number]) scope was introduced through the                            mouth, and advanced to the second part of duodenum.                            The upper GI endoscopy was accomplished without                            difficulty. The patient tolerated the procedure                            well. Scope In: Scope Out: Findings:      A medium-sized hiatal hernia was present.      The exam of the esophagus was otherwise normal.      A few medium sessile polyps were found in the cardia. Biopsies were       taken with a cold forceps for histology.      The exam of the stomach was otherwise normal.      The duodenal bulb, first portion of the duodenum and second portion of       the duodenum were normal. Impression:               - Medium-sized hiatal hernia.                           - A few gastric polyps. Biopsied.                           - Normal duodenal bulb, first portion of the                            duodenum and second portion of the duodenum. Moderate Sedation:      None      None Recommendation:           - Await pathology results.                           -  Follow an antireflux regimen indefinitely.                           - Continue present medications.                           - Perform a colonoscopy today. Procedure Code(s):        --- Professional ---                           [phone], Esophagogastroduodenoscopy, flexible,                            transoral; with biopsy, single or multiple Diagnosis Code(s):        --- Professional ---                           K44.9, Diaphragmatic hernia without obstruction or                            gangrene                           K31.7, Polyp of stomach and duodenum                           K21.9, Gastro-esophageal reflux disease without                            esophagitis                            R19.5, Other fecal abnormalities CPT copyright 2016 American Medical Association. All rights reserved. The codes documented in this report are preliminary and upon coder review may  be revised to meet current compliance requirements. Arta Silence, MD 02/24/2016 9:10:28 AM This report has been signed electronically. Number of Addenda: 0

## 2016-02-24 NOTE — Anesthesia Preprocedure Evaluation (Addendum)
Anesthesia Evaluation  Patient identified by MRN, date of birth, ID band Patient awake    Reviewed: Allergy & Precautions, NPO status , Patient's Chart, lab work & pertinent test results  Airway Mallampati: II  TM Distance: >3 FB Neck ROM: Full    Dental no notable dental hx. (+) Dental Advisory Given   Pulmonary neg pulmonary ROS,    Pulmonary exam normal breath sounds clear to auscultation       Cardiovascular hypertension, Normal cardiovascular exam Rhythm:Regular Rate:Normal     Neuro/Psych  Headaches, negative psych ROS   GI/Hepatic Neg liver ROS, hiatal hernia, GERD  ,  Endo/Other  negative endocrine ROS  Renal/GU negative Renal ROS  negative genitourinary   Musculoskeletal negative musculoskeletal ROS (+)   Abdominal (+) + obese,   Peds negative pediatric ROS (+)  Hematology negative hematology ROS (+)   Anesthesia Other Findings   Reproductive/Obstetrics negative OB ROS                            Anesthesia Physical Anesthesia Plan  ASA: II  Anesthesia Plan: MAC   Post-op Pain Management:    Induction: Intravenous  Airway Management Planned: Natural Airway  Additional Equipment:   Intra-op Plan:   Post-operative Plan:   Informed Consent: I have reviewed the patients History and Physical, chart, labs and discussed the procedure including the risks, benefits and alternatives for the proposed anesthesia with the patient or authorized representative who has indicated his/her understanding and acceptance.   Dental advisory given  Plan Discussed with: CRNA  Anesthesia Plan Comments:         Anesthesia Quick Evaluation

## 2016-02-25 ENCOUNTER — Encounter (HOSPITAL_COMMUNITY): Payer: Self-pay | Admitting: Gastroenterology

## 2016-03-25 MED FILL — CARVEDILOL 25 MG TABLET: 25 | 90 days supply | Qty: 180 | Fill #1

## 2016-03-25 MED FILL — PANTOPRAZOLE SOD DR 40 MG T: 40 | 90 days supply | Qty: 90 | Fill #1

## 2016-03-25 MED FILL — FENOFIBRATE 160 MG TABLET: 160 | 90 days supply | Qty: 90 | Fill #1

## 2016-03-25 MED FILL — VALSARTAN 320 MG TABLET: 320 | 90 days supply | Qty: 90 | Fill #1

## 2016-03-25 MED FILL — SPIRONOLACTONE 50 MG TABLET: 50 | 90 days supply | Qty: 90 | Fill #1

## 2016-03-28 MED FILL — DOXAZOSIN MESYLATE 8 MG TAB: 8 | 90 days supply | Qty: 135 | Fill #2

## 2016-04-25 DIAGNOSIS — E785 Hyperlipidemia, unspecified: Secondary | ICD-10-CM | POA: Diagnosis not present

## 2016-04-25 DIAGNOSIS — I10 Essential (primary) hypertension: Secondary | ICD-10-CM | POA: Diagnosis not present

## 2016-04-25 DIAGNOSIS — D649 Anemia, unspecified: Secondary | ICD-10-CM | POA: Diagnosis not present

## 2016-04-26 DIAGNOSIS — D649 Anemia, unspecified: Secondary | ICD-10-CM | POA: Diagnosis not present

## 2016-04-26 DIAGNOSIS — I10 Essential (primary) hypertension: Secondary | ICD-10-CM | POA: Diagnosis not present

## 2016-04-26 DIAGNOSIS — E7800 Pure hypercholesterolemia, unspecified: Secondary | ICD-10-CM | POA: Diagnosis not present

## 2016-06-24 MED FILL — DOXAZOSIN MESYLATE 8 MG TAB: 8 | 90 days supply | Qty: 135 | Fill #3

## 2016-06-24 MED FILL — VALSARTAN 320 MG TABLET: 320 | 90 days supply | Qty: 90 | Fill #2

## 2016-06-24 MED FILL — FENOFIBRATE 160 MG TABLET: 160 | 90 days supply | Qty: 90 | Fill #2

## 2016-06-24 MED FILL — SPIRONOLACTONE 50 MG TABLET: 50 | 90 days supply | Qty: 90 | Fill #2

## 2016-06-24 MED FILL — PANTOPRAZOLE SOD DR 40 MG T: 40 | 90 days supply | Qty: 90 | Fill #2

## 2016-06-24 MED FILL — CARVEDILOL 25 MG TABLET: 25 | 90 days supply | Qty: 180 | Fill #2

## 2016-09-28 ENCOUNTER — Other Ambulatory Visit: Payer: Self-pay | Admitting: Internal Medicine

## 2016-09-28 MED FILL — PANTOPRAZOLE SOD DR 40 MG T: 40 | 90 days supply | Qty: 90 | Fill #3

## 2016-09-28 MED FILL — SPIRONOLACTONE 50 MG TABLET: 50 | 90 days supply | Qty: 90 | Fill #3

## 2016-09-28 MED FILL — CARVEDILOL 25 MG TABLET: 25 | 90 days supply | Qty: 180 | Fill #3

## 2016-09-28 MED FILL — FENOFIBRATE 160 MG TABLET: 160 | 90 days supply | Qty: 90 | Fill #3

## 2016-09-28 MED FILL — VALSARTAN 320 MG TABLET: 320 | 90 days supply | Qty: 90 | Fill #3

## 2016-10-04 ENCOUNTER — Ambulatory Visit (HOSPITAL_COMMUNITY)
Admission: RE | Admit: 2016-10-04 | Discharge: 2016-10-04 | Disposition: A | Discharge status: Home / Self Care | Payer: 59 | Source: Ambulatory Visit | Attending: Internal Medicine | Admitting: Internal Medicine

## 2016-10-04 VITALS — BP 176/102 | HR 58 | Wt 304.4 lb

## 2016-10-04 DIAGNOSIS — Z7982 Long term (current) use of aspirin: Secondary | ICD-10-CM | POA: Diagnosis not present

## 2016-10-04 DIAGNOSIS — K219 Gastro-esophageal reflux disease without esophagitis: Secondary | ICD-10-CM | POA: Insufficient documentation

## 2016-10-04 DIAGNOSIS — E781 Pure hyperglyceridemia: Secondary | ICD-10-CM | POA: Diagnosis not present

## 2016-10-04 DIAGNOSIS — E785 Hyperlipidemia, unspecified: Secondary | ICD-10-CM | POA: Diagnosis not present

## 2016-10-04 DIAGNOSIS — Z79899 Other long term (current) drug therapy: Secondary | ICD-10-CM | POA: Diagnosis not present

## 2016-10-04 DIAGNOSIS — R51 Headache: Secondary | ICD-10-CM | POA: Insufficient documentation

## 2016-10-04 DIAGNOSIS — I10 Essential (primary) hypertension: Secondary | ICD-10-CM

## 2016-10-04 DIAGNOSIS — K449 Diaphragmatic hernia without obstruction or gangrene: Secondary | ICD-10-CM | POA: Insufficient documentation

## 2016-10-04 MED ORDER — DOXAZOSIN MESYLATE 8 MG PO TABS: ORAL_TABLET | ORAL | 3 refills | Status: AC

## 2016-10-04 MED FILL — DOXAZOSIN MESYLATE 8 MG TAB: 8 | 90 days supply | Qty: 135 | Fill #0

## 2016-10-04 NOTE — Progress Notes (Signed)
Patient ID: Albert Leach, male   DOB: [birth year], 51 y.o.   MRN: [id number]   Referring Physician: Blenda Mounts, MD ([place]) Reason for Consultation: HTN  HPI:    Albert Leach is a 51 y.o. 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   First diagnosed with HTN in college when he went to donate blood. Started therapy when he was 20. Was in amlodipine trial but had severe edema and had to stop. Family history is not notable for severe HTN. Has been on multiple medications recently (see below)   He presents today for regular follow up. Not seen in clinic since 10/2014. Needs refills. Highest his BP has been at home is 140/80. Denies SOB. 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 Exercising less, but trying to get back into it as it gets warmer.  Last visit cardura was increased. Working outside a lot. Denies SOB. SBP 120-140s. Better controlled. Continue to work nights for [employer]. .  08/31/12: Stress test normal   LABS  10/06/2014 K 4.0 Creatinine 1.2 LDL 95 HDL 52 TGL 96.   Review of Systems: All pertinent positives and negatives as in HPI, otherwise negative  Home Medications Current Outpatient Prescriptions on File Prior to Encounter  Medication Sig Dispense Refill  . aspirin 81 MG tablet Take 1 tablet (81 mg total) by mouth daily. 30 tablet   . carvedilol (COREG) 25 MG tablet Take 25 mg by mouth 2 (two) times daily with a meal.    . doxazosin (CARDURA) 8 MG tablet TAKE 1/2 TABLET BY MOUTH IN THE MORNING AND 1 TABLET IN THE EVENING 135 tablet 3  . fenofibrate 160 MG tablet Take 160 mg by mouth daily.    . Multiple Vitamin (MULTIVITAMIN) tablet Take 1 tablet by mouth daily.    . pantoprazole (PROTONIX) 40 MG tablet Take 40 mg by mouth daily.    Marland Kitchen spironolactone (ALDACTONE) 50 MG tablet TAKE 1 TABLET BY MOUTH DAILY 90 tablet 3  . valsartan (DIOVAN) 320 MG tablet Take 320 mg by mouth daily.     No current facility-administered medications  on file prior to encounter.    Past Medical History: Past Medical History:  Diagnosis Date  . GERD (gastroesophageal reflux disease)   . Headache    headache with hypertension med of "level 4"  . History of hiatal hernia   . Hypertension   . Hypertriglyceridemia   . Schatzki's ring   . Seasonal allergies     Objective:    Vital Signs:  Vitals:   10/04/16 1032  BP: (!) 176/102  Pulse: (!) 58  SpO2: 98%  Weight: (!) 304 lb 6.4 oz (138.1 kg)   Physical Exam: General:  Tall, well appearing caucasian male in NAD.  HEENT: Normal Neck: Supple. JVP 6-7 cm. Carotids 2+ bilat; no bruits. No thyromegaly or nodule noted.  Cor: PMI non-displaced. RRR. No M/G/R noted. No S4 noted.  Lungs: Clear, normal effort.  Abdomen: Soft, NT, ND, no HSM. No bruits or masses. +BS  Extremities: No clubbing, cyanosis, rash, or edema.  Neuro: Alert & oriented x 3. Cranial nerves grossly intact. Moves all 4 extremities w/o difficulty. Affect pleasant    ASSESSMENT & PLAN  1) HTN - BP elevated today in setting of not taking his medications yet and working 3 night shifts in a row.    - Continue coreg 25 mg BID - Continue valsartan 320 mg daily  - Continue  spiro 50 mg daily - Continue cardura 4 mg q am and 8 mg q pm.  - He has been stable on this regimen for quite some time. Pt knows to call if BP elevated with any regularity.   2) HLD - Managed by Dr. Edson Snowball - Continue ASA and fenofibrate. Per pt last LDL was WNL.  - Have ordered a Coronary CT. If abnormal, will empirically start on statin, and will eventually need angiography with his extensive family history.   Follow up in 12 months. Sooner with symptoms.   Shirley Friar, PA-C  10:34 AM  Patient seen and examined with the above-signed Advanced Practice Provider and/or Housestaff. I personally reviewed laboratory data, imaging studies and relevant notes. I independently examined the patient and formulated the important aspects of  the plan. I have edited the note to reflect any of my changes or salient points. I have personally discussed the plan with the patient and/or family.  BP well controlled at baseline. (Up today because he missed am meds). Cholesterol followed by PCP. Well controlled on Tricor. Would have low threshold to add statin. Will get cardiac CT for calcium scoring.   Glori Bickers, MD  12:51 PM

## 2016-10-04 NOTE — Patient Instructions (Addendum)
Will order CT Coronary at [facility]. Radiology department will contact you to schedule.  No labs or medication changes today.  Follow up 12 months with Dr. Haroldine Laws.  Do the following things EVERYDAY: 1) Weigh yourself in the morning before breakfast. Write it down and keep it in a log. 2) Take your medicines as prescribed 3) Eat low salt foods-Limit salt (sodium) to 2000 mg per day.  4) Stay as active as you can everyday 5) Limit all fluids for the day to less than 2 liters

## 2016-10-04 NOTE — Progress Notes (Signed)
Advanced Heart Failure Medication Review by a Pharmacist  Does the patient  feel that his/her medications are working for him/her?  yes  Has the patient been experiencing any side effects to the medications prescribed?  no  Does the patient measure his/her own blood pressure or blood glucose at home?  yes   Does the patient have any problems obtaining medications due to transportation or finances?   no  Understanding of regimen: good Understanding of indications: good Potential of compliance: good Patient understands to avoid NSAIDs. Patient understands to avoid decongestants.  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 He reports good compliance with his regimen and did not have any specific medication-related questions or concerns for me at this time.   Ruta Hinds. Velva Harman, PharmD, BCPS, CPP Clinical Pharmacist Pager: [phone] Phone: [phone] 10/04/2016 10:41 AM      Time with patient: 8 minutes Preparation and documentation time: 2 minutes Total time: 10 minutes

## 2016-10-10 ENCOUNTER — Ambulatory Visit (HOSPITAL_COMMUNITY)
Admission: RE | Admit: 2016-10-10 | Discharge: 2016-10-10 | Disposition: A | Discharge status: Home / Self Care | Payer: 59 | Source: Ambulatory Visit | Attending: Student | Admitting: Student

## 2016-10-10 DIAGNOSIS — E785 Hyperlipidemia, unspecified: Secondary | ICD-10-CM | POA: Insufficient documentation

## 2016-10-10 DIAGNOSIS — R079 Chest pain, unspecified: Secondary | ICD-10-CM | POA: Diagnosis not present

## 2016-10-10 MED ORDER — NITROGLYCERIN 0.4 MG SL SUBL
0.8000 mg | SUBLINGUAL_TABLET | Freq: Once | SUBLINGUAL | Status: DC
  Filled 2016-10-10: qty 25

## 2016-10-10 MED ORDER — NITROGLYCERIN 0.4 MG SL SUBL
SUBLINGUAL_TABLET | SUBLINGUAL | Status: AC
  Administered 2016-10-10: 0.8 mg
  Filled 2016-10-10: qty 1

## 2016-10-10 MED ORDER — IOPAMIDOL (ISOVUE-370) INJECTION 76%
INTRAVENOUS | Status: AC
Start: 2016-10-10 — End: 2016-10-10
  Administered 2016-10-10: 80 mL
  Filled 2016-10-10: qty 100

## 2016-11-07 DIAGNOSIS — Z125 Encounter for screening for malignant neoplasm of prostate: Secondary | ICD-10-CM | POA: Diagnosis not present

## 2016-11-07 DIAGNOSIS — Z0000 Encounter for general adult medical examination without abnormal findings: Secondary | ICD-10-CM | POA: Diagnosis not present

## 2016-11-25 DIAGNOSIS — D649 Anemia, unspecified: Secondary | ICD-10-CM | POA: Diagnosis not present

## 2016-11-25 DIAGNOSIS — K210 Gastro-esophageal reflux disease with esophagitis: Secondary | ICD-10-CM | POA: Diagnosis not present

## 2016-11-25 DIAGNOSIS — E7800 Pure hypercholesterolemia, unspecified: Secondary | ICD-10-CM | POA: Diagnosis not present

## 2016-11-25 DIAGNOSIS — I129 Hypertensive chronic kidney disease with stage 1 through stage 4 chronic kidney disease, or unspecified chronic kidney disease: Secondary | ICD-10-CM | POA: Diagnosis not present

## 2016-11-25 DIAGNOSIS — Z0000 Encounter for general adult medical examination without abnormal findings: Secondary | ICD-10-CM | POA: Diagnosis not present

## 2016-11-25 DIAGNOSIS — N182 Chronic kidney disease, stage 2 (mild): Secondary | ICD-10-CM | POA: Diagnosis not present

## 2016-11-25 DIAGNOSIS — J301 Allergic rhinitis due to pollen: Secondary | ICD-10-CM | POA: Diagnosis not present

## 2016-12-26 MED FILL — VALSARTAN 320 MG TAB: 320 | 90 days supply | Qty: 90 | Fill #0

## 2016-12-26 MED FILL — CARVEDILOL 25 MG TABLET: 25 | 90 days supply | Qty: 180 | Fill #0

## 2016-12-26 MED FILL — DOXAZOSIN MESYLATE 8 MG TAB: 8 | 90 days supply | Qty: 135 | Fill #0

## 2016-12-26 MED FILL — PANTOPRAZOLE SOD DR 40 MG T: 40 | 90 days supply | Qty: 90 | Fill #0

## 2016-12-26 MED FILL — SPIRONOLACTONE 50 MG TAB: 50 | 90 days supply | Qty: 90 | Fill #0

## 2016-12-26 MED FILL — FENOFIBRATE 160 MG TABLET: 160 | 90 days supply | Qty: 90 | Fill #0

## 2017-02-13 DIAGNOSIS — D649 Anemia, unspecified: Secondary | ICD-10-CM | POA: Diagnosis not present

## 2017-02-28 DIAGNOSIS — I10 Essential (primary) hypertension: Secondary | ICD-10-CM | POA: Diagnosis not present

## 2017-02-28 DIAGNOSIS — D649 Anemia, unspecified: Secondary | ICD-10-CM | POA: Diagnosis not present

## 2017-02-28 MED FILL — IRBESARTAN 300 MG TABLET: 300 | 90 days supply | Qty: 90 | Fill #0

## 2017-03-24 MED FILL — SPIRONOLACTONE 50 MG TAB: 50 | 90 days supply | Qty: 90 | Fill #1

## 2017-03-24 MED FILL — FENOFIBRATE 160 MG TABLET: 160 | 90 days supply | Qty: 90 | Fill #1

## 2017-03-24 MED FILL — DOXAZOSIN MESYLATE 8 MG TAB: 8 | 90 days supply | Qty: 135 | Fill #1

## 2017-03-24 MED FILL — CARVEDILOL 25 MG TABLET: 25 | 90 days supply | Qty: 180 | Fill #1

## 2017-03-24 MED FILL — PANTOPRAZOLE SOD DR 40 MG T: 40 | 90 days supply | Qty: 90 | Fill #1

## 2017-06-23 MED FILL — FENOFIBRATE 160 MG TABLET: 160 | 90 days supply | Qty: 90 | Fill #2

## 2017-06-23 MED FILL — DOXAZOSIN MESYLATE 8 MG TAB: 8 | 90 days supply | Qty: 135 | Fill #2

## 2017-06-23 MED FILL — PANTOPRAZOLE SOD DR 40 MG T: 40 | 90 days supply | Qty: 90 | Fill #2

## 2017-06-23 MED FILL — SPIRONOLACTONE 50 MG TAB: 50 | 90 days supply | Qty: 90 | Fill #2

## 2017-06-23 MED FILL — CARVEDILOL 25 MG TABLET: 25 | 90 days supply | Qty: 180 | Fill #2

## 2017-06-23 MED FILL — IRBESARTAN 300 MG TABLET: 300 | 90 days supply | Qty: 90 | Fill #1

## 2017-08-22 DIAGNOSIS — D649 Anemia, unspecified: Secondary | ICD-10-CM | POA: Diagnosis not present

## 2017-08-22 DIAGNOSIS — E7800 Pure hypercholesterolemia, unspecified: Secondary | ICD-10-CM | POA: Diagnosis not present

## 2017-08-22 DIAGNOSIS — N182 Chronic kidney disease, stage 2 (mild): Secondary | ICD-10-CM | POA: Diagnosis not present

## 2017-08-29 DIAGNOSIS — N182 Chronic kidney disease, stage 2 (mild): Secondary | ICD-10-CM | POA: Diagnosis not present

## 2017-08-29 DIAGNOSIS — D649 Anemia, unspecified: Secondary | ICD-10-CM | POA: Diagnosis not present

## 2017-08-29 DIAGNOSIS — I129 Hypertensive chronic kidney disease with stage 1 through stage 4 chronic kidney disease, or unspecified chronic kidney disease: Secondary | ICD-10-CM | POA: Diagnosis not present

## 2017-08-29 DIAGNOSIS — E7800 Pure hypercholesterolemia, unspecified: Secondary | ICD-10-CM | POA: Diagnosis not present

## 2017-09-15 MED FILL — FENOFIBRATE 160 MG TABLET: 160 | 90 days supply | Qty: 90 | Fill #3

## 2017-09-15 MED FILL — SPIRONOLACTONE 50 MG TABS: 50 | 90 days supply | Qty: 90 | Fill #3

## 2017-09-15 MED FILL — IRBESARTAN 300 MG TABLET: 300 | 90 days supply | Qty: 90 | Fill #2

## 2017-09-15 MED FILL — CARVEDILOL 25 MG TABLET: 25 | 90 days supply | Qty: 180 | Fill #3

## 2017-09-15 MED FILL — DOXAZOSIN MESYLATE 8 MG TAB: 8 | 90 days supply | Qty: 135 | Fill #3

## 2017-09-15 MED FILL — PANTOPRAZOLE SOD DR 40 MG T: 40 | 90 days supply | Qty: 90 | Fill #3

## 2017-12-15 MED FILL — IRBESARTAN 300 MG TABLET: 300 | 90 days supply | Qty: 90 | Fill #3

## 2017-12-15 MED FILL — DOXAZOSIN MESYLATE 8 MG TAB: 8 | 90 days supply | Qty: 135 | Fill #0

## 2017-12-15 MED FILL — PANTOPRAZOLE SOD DR 40 MG T: 40 | 90 days supply | Qty: 90 | Fill #0

## 2017-12-15 MED FILL — SPIRONOLACTONE 50 MG TAB: 50 | 90 days supply | Qty: 90 | Fill #0

## 2017-12-15 MED FILL — CARVEDILOL 25 MG TABLET: 25 | 90 days supply | Qty: 180 | Fill #0

## 2017-12-15 MED FILL — FENOFIBRATE 160 MG TABLET: 160 | 90 days supply | Qty: 90 | Fill #0

## 2018-02-26 DIAGNOSIS — Z0000 Encounter for general adult medical examination without abnormal findings: Secondary | ICD-10-CM | POA: Diagnosis not present

## 2018-03-05 DIAGNOSIS — N182 Chronic kidney disease, stage 2 (mild): Secondary | ICD-10-CM | POA: Diagnosis not present

## 2018-03-05 DIAGNOSIS — D649 Anemia, unspecified: Secondary | ICD-10-CM | POA: Diagnosis not present

## 2018-03-05 DIAGNOSIS — I129 Hypertensive chronic kidney disease with stage 1 through stage 4 chronic kidney disease, or unspecified chronic kidney disease: Secondary | ICD-10-CM | POA: Diagnosis not present

## 2018-03-05 DIAGNOSIS — J301 Allergic rhinitis due to pollen: Secondary | ICD-10-CM | POA: Diagnosis not present

## 2018-03-05 DIAGNOSIS — E7800 Pure hypercholesterolemia, unspecified: Secondary | ICD-10-CM | POA: Diagnosis not present

## 2018-03-05 DIAGNOSIS — Z0000 Encounter for general adult medical examination without abnormal findings: Secondary | ICD-10-CM | POA: Diagnosis not present

## 2018-03-05 DIAGNOSIS — K210 Gastro-esophageal reflux disease with esophagitis: Secondary | ICD-10-CM | POA: Diagnosis not present

## 2018-03-23 MED FILL — PANTOPRAZOLE SOD DR 40 MG T: 40 | 90 days supply | Qty: 90 | Fill #1

## 2018-03-23 MED FILL — SPIRONOLACTONE 50 MG TABLET: 50 | 90 days supply | Qty: 90 | Fill #1

## 2018-03-23 MED FILL — FENOFIBRATE 160 MG TABLET: 160 | 90 days supply | Qty: 90 | Fill #1

## 2018-03-23 MED FILL — CARVEDILOL 25 MG TABLET: 25 | 90 days supply | Qty: 180 | Fill #1

## 2018-03-23 MED FILL — DOXAZOSIN MESYLATE 8 MG TAB: 8 | 90 days supply | Qty: 135 | Fill #1

## 2018-03-23 MED FILL — IRBESARTAN 300 MG TAB: 300 | 90 days supply | Qty: 90 | Fill #0

## 2018-06-22 MED FILL — CARVEDILOL 25 MG TABLET: 25 | 90 days supply | Qty: 180 | Fill #2

## 2018-06-22 MED FILL — SPIRONOLACTONE 50 MG TABS: 50 | 90 days supply | Qty: 90 | Fill #2

## 2018-06-22 MED FILL — DOXAZOSIN MESYLATE 8 MG TAB: 8 | 90 days supply | Qty: 135 | Fill #2

## 2018-06-22 MED FILL — FENOFIBRATE 160 MG TABLET: 160 | 90 days supply | Qty: 90 | Fill #2

## 2018-06-22 MED FILL — PANTOPRAZOLE SOD DR 40 MG T: 40 | 90 days supply | Qty: 90 | Fill #2

## 2018-06-22 MED FILL — IRBESARTAN 300 MG TAB: 300 | 90 days supply | Qty: 90 | Fill #1

## 2018-09-14 MED FILL — IRBESARTAN 300 MG TAB: 300 | 90 days supply | Qty: 90 | Fill #0

## 2018-09-14 MED FILL — DOXAZOSIN MESYLATE 8 MG TAB: 8 | 90 days supply | Qty: 135 | Fill #0

## 2018-09-14 MED FILL — CARVEDILOL 25 MG TABLET: 25 | 90 days supply | Qty: 180 | Fill #0

## 2018-09-14 MED FILL — SPIRONOLACTONE 50 MG TABS: 50 | 90 days supply | Qty: 90 | Fill #0

## 2018-09-14 MED FILL — PANTOPRAZOLE SOD DR 40 MG T: 40 | 90 days supply | Qty: 90 | Fill #0

## 2018-09-14 MED FILL — FENOFIBRATE 160 MG TABLET: 160 | 90 days supply | Qty: 90 | Fill #0

## 2018-10-15 DIAGNOSIS — N182 Chronic kidney disease, stage 2 (mild): Secondary | ICD-10-CM | POA: Diagnosis not present

## 2018-10-15 DIAGNOSIS — E7800 Pure hypercholesterolemia, unspecified: Secondary | ICD-10-CM | POA: Diagnosis not present

## 2018-10-15 DIAGNOSIS — I129 Hypertensive chronic kidney disease with stage 1 through stage 4 chronic kidney disease, or unspecified chronic kidney disease: Secondary | ICD-10-CM | POA: Diagnosis not present

## 2018-10-15 DIAGNOSIS — D649 Anemia, unspecified: Secondary | ICD-10-CM | POA: Diagnosis not present

## 2018-10-23 DIAGNOSIS — N182 Chronic kidney disease, stage 2 (mild): Secondary | ICD-10-CM | POA: Diagnosis not present

## 2018-10-23 DIAGNOSIS — I129 Hypertensive chronic kidney disease with stage 1 through stage 4 chronic kidney disease, or unspecified chronic kidney disease: Secondary | ICD-10-CM | POA: Diagnosis not present

## 2018-10-23 DIAGNOSIS — D649 Anemia, unspecified: Secondary | ICD-10-CM | POA: Diagnosis not present

## 2018-10-23 DIAGNOSIS — E7800 Pure hypercholesterolemia, unspecified: Secondary | ICD-10-CM | POA: Diagnosis not present

## 2018-12-06 MED FILL — PANTOPRAZOLE SOD DR 40 MG T: 40 | 90 days supply | Qty: 90 | Fill #0

## 2018-12-06 MED FILL — CARVEDILOL 25 MG TABLET: 25 | 90 days supply | Qty: 180 | Fill #0

## 2018-12-06 MED FILL — FENOFIBRATE 160 MG TABLET: 160 | 90 days supply | Qty: 90 | Fill #0

## 2018-12-06 MED FILL — IRBESARTAN 300 MG TABLET: 300 | 30 days supply | Qty: 30 | Fill #1

## 2018-12-06 MED FILL — SPIRONOLACTONE 50 MG TABS: 50 | 90 days supply | Qty: 90 | Fill #0

## 2018-12-06 MED FILL — DOXAZOSIN MESYLATE 8 MG TAB: 8 | 90 days supply | Qty: 135 | Fill #0

## 2018-12-07 DIAGNOSIS — D225 Melanocytic nevi of trunk: Secondary | ICD-10-CM | POA: Diagnosis not present

## 2018-12-07 DIAGNOSIS — D1801 Hemangioma of skin and subcutaneous tissue: Secondary | ICD-10-CM | POA: Diagnosis not present

## 2018-12-07 DIAGNOSIS — D485 Neoplasm of uncertain behavior of skin: Secondary | ICD-10-CM | POA: Diagnosis not present

## 2018-12-26 IMAGING — CT CT HEART MORP W/ CTA COR W/ SCORE W/ CA W/CM &/OR W/O CM
1 of 10 series · 1 of 20 positions shown, 2 images · IV contrast (Iodine)
Comparison: None.

CLINICAL DATA: 50-year-old male with PMH of obesity,
hyperlipidemia, hypertension and atypical chest pain.

EXAM:
Cardiac/Coronary  CT
TECHNIQUE: The patient was scanned on a Philips 256 scanner.

[Series 300: locator · axial · 0.35mm/px · z∈[+36,+36]mm · 1 of 1 slices shown, 2 images]
[im 1/1  vessel]
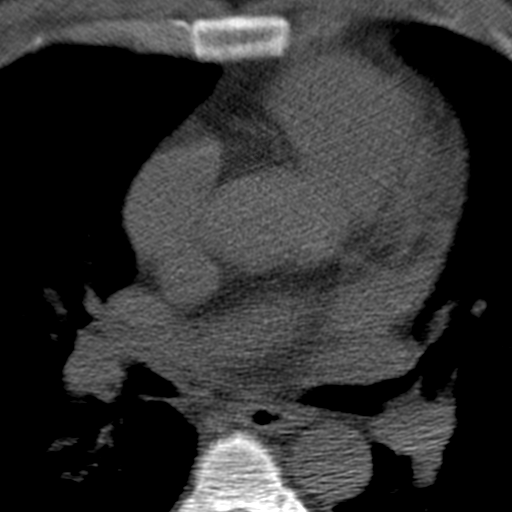
[im 1/1  lung]
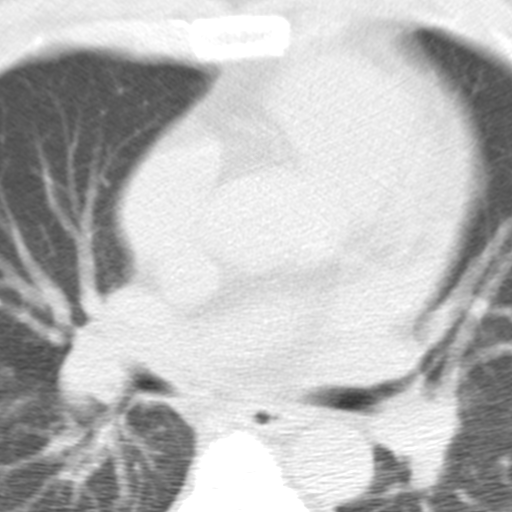

[1 of 20 positions shown; findings below may reference images not displayed]

FINDINGS: A 120 kV prospective scan was triggered in the descending thoracic
aorta at 111 HU's. Axial non-contrast 3 mm slices were carried out
through the heart. The data set was analyzed on a dedicated work
station and scored using the Agatson method. Gantry rotation speed
was 270 msecs and collimation was .9 mm. No beta blockade and 0.8 mg
of sl NTG was given. The 3D data set was reconstructed in 5%
intervals of the 67-82 % of the R-R cycle. Diastolic phases were
analyzed on a dedicated work station using MPR, MIP and VRT modes.
The patient received 80 cc of contrast.

Aorta:  Normal size.  No calcifications.  No dissection.

Aortic Valve:  Trileaflet.  No calcifications.

Coronary Arteries:  Normal coronary origin.  Right dominance.

The study quality is affected significantly by patient's size and
motion.

RCA is a large dominant artery that gives rise to PDA and PLVB.
Proximal and distal RCA has no plaque. Mid RCA is un interpretable
secondary to significant motion.

Left main is a large artery that gives rise to LAD and LCX arteries.
LM has no plaque.

LAD is a large vessel that gives rise to one diagonal branch. No
plaque is seen in the proximal LAD, D1. There is motion in the mid
LAD that unables interpretation.

LCX is a non-dominant artery that gives rise to one large OM1
branch. LCX is un interpretable secondary to significant motion.

Other findings:

Normal pulmonary vein drainage into the left atrium.

Normal let atrial appendage without a thrombus.

No ASD/VSD.

Upper normal size of the pulmonary artery measuring 29 x 27 mm.
IMPRESSION: 1. Coronary calcium score of 0. This was 0 percentile for age and
sex matched control.

2. Normal coronary origin with right dominance.

3. The study quality is affected significantly by patient's size and
motion. No significant plaque is seen in the visualized arteries not
affected by motion.

4. Upper normal size of the pulmonary artery measuring 29 x 27 mm.

Neri Ettinger

EXAM:
OVER-READ INTERPRETATION  CT CHEST

The following report is an over-read performed by radiologist Dr.
Si Yon Sknghwan [REDACTED] on 10/10/2016. This over-read
does not include interpretation of cardiac or coronary anatomy or
pathology. The coronary CTA interpretation by the cardiologist is
attached.
FINDINGS: Cardiovascular: Visualized aorta is normal caliber.  No dissection.

Mediastinum/Nodes: No adenopathy in in the visualized mediastinum or
hila. Suspect small hiatal hernia.

Lungs/Pleura: Visualized lungs are clear.  No effusions.

Upper Abdomen: Imaging into the upper abdomen shows no acute
findings.

Musculoskeletal: Chest wall soft tissues are unremarkable. No acute
bony abnormality.
IMPRESSION: No acute or significant extracardiac abnormality.

## 2019-01-05 MED FILL — IRBESARTAN 300 MG TABLET: 300 | 30 days supply | Qty: 30 | Fill #2

## 2019-02-05 MED FILL — IRBESARTAN 300 MG TABS: 300 | 30 days supply | Qty: 30 | Fill #3

## 2019-03-04 DIAGNOSIS — D649 Anemia, unspecified: Secondary | ICD-10-CM | POA: Diagnosis not present

## 2019-03-04 DIAGNOSIS — Z0000 Encounter for general adult medical examination without abnormal findings: Secondary | ICD-10-CM | POA: Diagnosis not present

## 2019-03-04 MED FILL — PANTOPRAZOLE SOD DR 40 MG T: 40 | 90 days supply | Qty: 90 | Fill #0

## 2019-03-04 MED FILL — DOXAZOSIN MESYLATE 8 MG TAB: 8 | 90 days supply | Qty: 135 | Fill #0

## 2019-03-04 MED FILL — FENOFIBRATE 160 MG TABLET: 160 | 90 days supply | Qty: 90 | Fill #0

## 2019-03-04 MED FILL — CARVEDILOL 25 MG TABLET: 25 | 90 days supply | Qty: 180 | Fill #0

## 2019-03-04 MED FILL — SPIRONOLACTONE 50 MG TABS: 50 | 90 days supply | Qty: 90 | Fill #0

## 2019-03-11 DIAGNOSIS — Z23 Encounter for immunization: Secondary | ICD-10-CM | POA: Diagnosis not present

## 2019-03-11 DIAGNOSIS — E7800 Pure hypercholesterolemia, unspecified: Secondary | ICD-10-CM | POA: Diagnosis not present

## 2019-03-11 DIAGNOSIS — I129 Hypertensive chronic kidney disease with stage 1 through stage 4 chronic kidney disease, or unspecified chronic kidney disease: Secondary | ICD-10-CM | POA: Diagnosis not present

## 2019-03-11 DIAGNOSIS — K210 Gastro-esophageal reflux disease with esophagitis: Secondary | ICD-10-CM | POA: Diagnosis not present

## 2019-03-11 DIAGNOSIS — Z0000 Encounter for general adult medical examination without abnormal findings: Secondary | ICD-10-CM | POA: Diagnosis not present

## 2019-03-11 DIAGNOSIS — D649 Anemia, unspecified: Secondary | ICD-10-CM | POA: Diagnosis not present

## 2019-03-11 DIAGNOSIS — N182 Chronic kidney disease, stage 2 (mild): Secondary | ICD-10-CM | POA: Diagnosis not present

## 2019-03-12 MED FILL — IRBESARTAN 300 MG TABS: 300 | 90 days supply | Qty: 90 | Fill #0

## 2019-06-27 MED FILL — IRBESARTAN 300 MG TABS: 300 | 90 days supply | Qty: 90 | Fill #1

## 2019-06-27 MED FILL — DOXAZOSIN MESYLATE 8 MG TAB: 8 | 90 days supply | Qty: 135 | Fill #0

## 2019-06-27 MED FILL — SPIRONOLACTONE 50 MG TABS: 50 | 90 days supply | Qty: 90 | Fill #0

## 2019-06-27 MED FILL — CARVEDILOL 25 MG TABLET: 25 | 90 days supply | Qty: 180 | Fill #0

## 2019-06-27 MED FILL — FENOFIBRATE 160 MG TABLET: 160 | 90 days supply | Qty: 90 | Fill #0

## 2019-06-27 MED FILL — PANTOPRAZOLE SOD DR 40 MG T: 40 | 90 days supply | Qty: 90 | Fill #0

## 2019-09-18 MED FILL — FENOFIBRATE 160 MG TABLET: 160 | 90 days supply | Qty: 90 | Fill #1

## 2019-09-18 MED FILL — DOXAZOSIN MESYLATE 8 MG TAB: 8 | 90 days supply | Qty: 135 | Fill #1

## 2019-09-18 MED FILL — PANTOPRAZOLE SOD DR 40 MG T: 40 | 90 days supply | Qty: 90 | Fill #1

## 2019-09-18 MED FILL — SPIRONOLACTONE 50 MG TABLET: 50 | 90 days supply | Qty: 90 | Fill #1

## 2019-09-18 MED FILL — CARVEDILOL 25 MG TABLET: 25 | 90 days supply | Qty: 180 | Fill #1

## 2019-09-20 ENCOUNTER — Other Ambulatory Visit (HOSPITAL_COMMUNITY): Payer: Self-pay | Admitting: Family Medicine

## 2019-09-20 MED FILL — IRBESARTAN 300 MG TAB: 300 | 90 days supply | Qty: 90 | Fill #0

## 2019-10-22 DIAGNOSIS — R252 Cramp and spasm: Secondary | ICD-10-CM | POA: Diagnosis not present

## 2019-10-22 DIAGNOSIS — K2100 Gastro-esophageal reflux disease with esophagitis, without bleeding: Secondary | ICD-10-CM | POA: Diagnosis not present

## 2019-10-22 DIAGNOSIS — I129 Hypertensive chronic kidney disease with stage 1 through stage 4 chronic kidney disease, or unspecified chronic kidney disease: Secondary | ICD-10-CM | POA: Diagnosis not present

## 2019-10-22 DIAGNOSIS — E7800 Pure hypercholesterolemia, unspecified: Secondary | ICD-10-CM | POA: Diagnosis not present

## 2019-10-22 DIAGNOSIS — D649 Anemia, unspecified: Secondary | ICD-10-CM | POA: Diagnosis not present

## 2019-10-22 DIAGNOSIS — N182 Chronic kidney disease, stage 2 (mild): Secondary | ICD-10-CM | POA: Diagnosis not present

## 2019-12-17 MED FILL — SPIRONOLACTONE 50 MG TABLET: 50 | 90 days supply | Qty: 90 | Fill #2

## 2019-12-17 MED FILL — CARVEDILOL 25 MG TABLET: 25 | 90 days supply | Qty: 180 | Fill #2

## 2019-12-17 MED FILL — PANTOPRAZOLE SOD DR 40 MG T: 40 | 90 days supply | Qty: 90 | Fill #2

## 2019-12-17 MED FILL — DOXAZOSIN MESYLATE 8 MG TAB: 8 | 90 days supply | Qty: 135 | Fill #2

## 2019-12-17 MED FILL — FENOFIBRATE 160 MG TABLET: 160 | 90 days supply | Qty: 90 | Fill #2

## 2019-12-20 MED FILL — IRBESARTAN 300 MG TAB: 300 | 90 days supply | Qty: 90 | Fill #1

## 2020-02-21 DIAGNOSIS — Z1159 Encounter for screening for other viral diseases: Secondary | ICD-10-CM | POA: Diagnosis not present

## 2020-03-16 MED FILL — IRBESARTAN 300 MG TAB: 300 | 90 days supply | Qty: 90 | Fill #0

## 2020-03-19 ENCOUNTER — Other Ambulatory Visit (HOSPITAL_COMMUNITY): Payer: Self-pay | Admitting: Family Medicine

## 2020-03-19 MED FILL — FENOFIBRATE 160 MG TABLET: 160 | 90 days supply | Qty: 90 | Fill #0

## 2020-03-19 MED FILL — SPIRONOLACTONE 50 MG TABLET: 50 | 90 days supply | Qty: 90 | Fill #0

## 2020-03-19 MED FILL — PANTOPRAZOLE SOD DR 40 MG T: 40 | 90 days supply | Qty: 90 | Fill #0

## 2020-03-19 MED FILL — DOXAZOSIN MESYLATE 8 MG TAB: 8 | 90 days supply | Qty: 135 | Fill #0

## 2020-03-19 MED FILL — CARVEDILOL 25 MG TABLET: 25 | 90 days supply | Qty: 180 | Fill #0

## 2020-04-14 DIAGNOSIS — D649 Anemia, unspecified: Secondary | ICD-10-CM | POA: Diagnosis not present

## 2020-04-14 DIAGNOSIS — I129 Hypertensive chronic kidney disease with stage 1 through stage 4 chronic kidney disease, or unspecified chronic kidney disease: Secondary | ICD-10-CM | POA: Diagnosis not present

## 2020-04-14 DIAGNOSIS — Z125 Encounter for screening for malignant neoplasm of prostate: Secondary | ICD-10-CM | POA: Diagnosis not present

## 2020-04-14 DIAGNOSIS — R8290 Unspecified abnormal findings in urine: Secondary | ICD-10-CM | POA: Diagnosis not present

## 2020-04-14 DIAGNOSIS — E7800 Pure hypercholesterolemia, unspecified: Secondary | ICD-10-CM | POA: Diagnosis not present

## 2020-04-14 DIAGNOSIS — N182 Chronic kidney disease, stage 2 (mild): Secondary | ICD-10-CM | POA: Diagnosis not present

## 2020-04-14 DIAGNOSIS — Z0001 Encounter for general adult medical examination with abnormal findings: Secondary | ICD-10-CM | POA: Diagnosis not present

## 2020-04-14 DIAGNOSIS — Z79899 Other long term (current) drug therapy: Secondary | ICD-10-CM | POA: Diagnosis not present

## 2020-04-14 DIAGNOSIS — R252 Cramp and spasm: Secondary | ICD-10-CM | POA: Diagnosis not present

## 2020-04-21 DIAGNOSIS — E7800 Pure hypercholesterolemia, unspecified: Secondary | ICD-10-CM | POA: Diagnosis not present

## 2020-04-21 DIAGNOSIS — I129 Hypertensive chronic kidney disease with stage 1 through stage 4 chronic kidney disease, or unspecified chronic kidney disease: Secondary | ICD-10-CM | POA: Diagnosis not present

## 2020-04-21 DIAGNOSIS — K2100 Gastro-esophageal reflux disease with esophagitis, without bleeding: Secondary | ICD-10-CM | POA: Diagnosis not present

## 2020-04-21 DIAGNOSIS — R252 Cramp and spasm: Secondary | ICD-10-CM | POA: Diagnosis not present

## 2020-04-21 DIAGNOSIS — D649 Anemia, unspecified: Secondary | ICD-10-CM | POA: Diagnosis not present

## 2020-04-21 DIAGNOSIS — N182 Chronic kidney disease, stage 2 (mild): Secondary | ICD-10-CM | POA: Diagnosis not present

## 2020-04-21 DIAGNOSIS — Z0000 Encounter for general adult medical examination without abnormal findings: Secondary | ICD-10-CM | POA: Diagnosis not present

## 2020-05-07 DIAGNOSIS — N182 Chronic kidney disease, stage 2 (mild): Secondary | ICD-10-CM | POA: Diagnosis not present

## 2020-06-17 MED FILL — PANTOPRAZOLE SOD DR 40 MG T: 40 | 90 days supply | Qty: 90 | Fill #1

## 2020-06-17 MED FILL — IRBESARTAN 300 MG TABS: 300 | 90 days supply | Qty: 90 | Fill #1

## 2020-06-17 MED FILL — DOXAZOSIN MESYLATE 8 MG TAB: 8 | 90 days supply | Qty: 135 | Fill #1

## 2020-06-17 MED FILL — SPIRONOLACTONE 50 MG TABLET: 50 | 90 days supply | Qty: 90 | Fill #1

## 2020-06-17 MED FILL — CARVEDILOL 25 MG TABS: 25 | 90 days supply | Qty: 180 | Fill #1

## 2020-06-17 MED FILL — FENOFIBRATE 160 MG TABLET: 160 | 90 days supply | Qty: 90 | Fill #1

## 2020-09-09 ENCOUNTER — Other Ambulatory Visit (HOSPITAL_BASED_OUTPATIENT_CLINIC_OR_DEPARTMENT_OTHER): Payer: Self-pay

## 2020-09-15 ENCOUNTER — Other Ambulatory Visit (HOSPITAL_COMMUNITY): Payer: Self-pay | Admitting: Family Medicine

## 2020-09-15 MED FILL — DOXAZOSIN MESYLATE 8 MG TAB: 8 | 90 days supply | Qty: 135 | Fill #2

## 2020-09-15 MED FILL — PANTOPRAZOLE SOD DR 40 MG T: 40 | 90 days supply | Qty: 90 | Fill #2

## 2020-09-15 MED FILL — FENOFIBRATE 160 MG TABLET: 160 | 90 days supply | Qty: 90 | Fill #2

## 2020-09-15 MED FILL — SPIRONOLACTONE 50 MG TABLET: 50 | 90 days supply | Qty: 90 | Fill #2

## 2020-09-15 MED FILL — CARVEDILOL 25 MG TABS: 25 | 90 days supply | Qty: 180 | Fill #2

## 2020-09-15 MED FILL — IRBESARTAN 300 MG TABS: 300 | 90 days supply | Qty: 90 | Fill #0

## 2020-12-08 ENCOUNTER — Other Ambulatory Visit (HOSPITAL_COMMUNITY): Payer: Self-pay

## 2020-12-08 MED FILL — Carvedilol Tab 25 MG: ORAL | 90 days supply | Qty: 180 | Fill #0 | Status: AC

## 2020-12-08 MED FILL — Pantoprazole Sodium EC Tab 40 MG (Base Equiv): ORAL | 90 days supply | Qty: 90 | Fill #0 | Status: AC

## 2020-12-08 MED FILL — Doxazosin Mesylate Tab 8 MG: ORAL | 90 days supply | Qty: 135 | Fill #0 | Status: AC

## 2020-12-08 MED FILL — Spironolactone Tab 50 MG: ORAL | 90 days supply | Qty: 90 | Fill #0 | Status: AC

## 2020-12-08 MED FILL — Irbesartan Tab 300 MG: ORAL | 90 days supply | Qty: 90 | Fill #0 | Status: AC

## 2020-12-08 MED FILL — Fenofibrate Tab 160 MG: ORAL | 90 days supply | Qty: 90 | Fill #0 | Status: AC

## 2020-12-09 ENCOUNTER — Other Ambulatory Visit (HOSPITAL_COMMUNITY): Payer: Self-pay

## 2021-03-16 ENCOUNTER — Other Ambulatory Visit (HOSPITAL_COMMUNITY): Payer: Self-pay

## 2021-03-16 MED ORDER — SPIRONOLACTONE 50 MG PO TABS
50.0000 mg | ORAL_TABLET | Freq: Every day | ORAL | 3 refills | Status: DC
  Filled 2021-03-16: qty 90, 90d supply, fill #0
  Filled 2021-06-15: qty 90, 90d supply, fill #1
  Filled 2021-09-10: qty 90, 90d supply, fill #2
  Filled 2021-12-10: qty 90, 90d supply, fill #3

## 2021-03-16 MED ORDER — PANTOPRAZOLE SODIUM 40 MG PO TBEC
40.0000 mg | DELAYED_RELEASE_TABLET | Freq: Every day | ORAL | 3 refills | Status: DC
  Filled 2021-03-16: qty 90, 90d supply, fill #0
  Filled 2021-06-15: qty 90, 90d supply, fill #1
  Filled 2021-09-10: qty 90, 90d supply, fill #2
  Filled 2021-12-10: qty 90, 90d supply, fill #3

## 2021-03-16 MED ORDER — DOXAZOSIN MESYLATE 8 MG PO TABS
ORAL_TABLET | ORAL | 3 refills | Status: DC
  Filled 2021-03-16: qty 135, 90d supply, fill #0
  Filled 2021-06-15: qty 135, 90d supply, fill #1
  Filled 2021-09-10: qty 135, 90d supply, fill #2
  Filled 2021-12-10: qty 135, 90d supply, fill #3

## 2021-03-16 MED ORDER — FENOFIBRATE 160 MG PO TABS
160.0000 mg | ORAL_TABLET | Freq: Every day | ORAL | 3 refills | Status: DC
  Filled 2021-03-16: qty 90, 90d supply, fill #0
  Filled 2021-06-15: qty 90, 90d supply, fill #1
  Filled 2021-09-10: qty 90, 90d supply, fill #2
  Filled 2021-12-10: qty 90, 90d supply, fill #3

## 2021-03-16 MED ORDER — CARVEDILOL 25 MG PO TABS
ORAL_TABLET | ORAL | 3 refills | Status: DC
  Filled 2021-03-16: qty 180, 90d supply, fill #0
  Filled 2021-09-15: qty 180, 90d supply, fill #1

## 2021-03-16 MED FILL — Irbesartan Tab 300 MG: ORAL | 90 days supply | Qty: 90 | Fill #1 | Status: AC

## 2021-03-17 ENCOUNTER — Other Ambulatory Visit (HOSPITAL_COMMUNITY): Payer: Self-pay

## 2021-03-18 ENCOUNTER — Other Ambulatory Visit (HOSPITAL_COMMUNITY): Payer: Self-pay

## 2021-06-15 ENCOUNTER — Other Ambulatory Visit (HOSPITAL_COMMUNITY): Payer: Self-pay

## 2021-06-15 MED FILL — Irbesartan Tab 300 MG: ORAL | 90 days supply | Qty: 90 | Fill #2 | Status: AC

## 2021-09-10 ENCOUNTER — Other Ambulatory Visit (HOSPITAL_COMMUNITY): Payer: Self-pay

## 2021-09-15 ENCOUNTER — Other Ambulatory Visit (HOSPITAL_COMMUNITY): Payer: Self-pay

## 2021-09-15 MED ORDER — IRBESARTAN 300 MG PO TABS
300.0000 mg | ORAL_TABLET | Freq: Every day | ORAL | 3 refills | Status: DC
  Filled 2021-09-15: qty 90, 90d supply, fill #0
  Filled 2021-12-10: qty 90, 90d supply, fill #1
  Filled 2022-03-09: qty 90, 90d supply, fill #2
  Filled 2022-06-07: qty 90, 90d supply, fill #3

## 2021-09-24 ENCOUNTER — Other Ambulatory Visit (HOSPITAL_COMMUNITY): Payer: Self-pay

## 2021-12-10 ENCOUNTER — Other Ambulatory Visit (HOSPITAL_COMMUNITY): Payer: Self-pay

## 2022-03-09 ENCOUNTER — Other Ambulatory Visit (HOSPITAL_COMMUNITY): Payer: Self-pay

## 2022-03-09 MED ORDER — SPIRONOLACTONE 50 MG PO TABS
50.0000 mg | ORAL_TABLET | Freq: Every day | ORAL | 3 refills | Status: DC
  Filled 2022-03-09: qty 90, 90d supply, fill #0
  Filled 2022-06-07: qty 90, 90d supply, fill #1
  Filled 2022-09-05: qty 90, 90d supply, fill #2
  Filled 2022-12-07 (×2): qty 90, 90d supply, fill #3

## 2022-03-09 MED ORDER — FENOFIBRATE 160 MG PO TABS
160.0000 mg | ORAL_TABLET | Freq: Every day | ORAL | 3 refills | Status: DC
  Filled 2022-03-09: qty 90, 90d supply, fill #0
  Filled 2022-06-07: qty 90, 90d supply, fill #1
  Filled 2022-09-05: qty 90, 90d supply, fill #2
  Filled 2022-12-07 (×2): qty 90, 90d supply, fill #3

## 2022-03-09 MED ORDER — DOXAZOSIN MESYLATE 8 MG PO TABS
8.0000 mg | ORAL_TABLET | ORAL | 3 refills | Status: DC
  Filled 2022-03-09: qty 135, 90d supply, fill #0
  Filled 2022-06-07: qty 135, 90d supply, fill #1
  Filled 2022-09-05: qty 135, 90d supply, fill #2
  Filled 2022-12-07 (×2): qty 135, 90d supply, fill #3

## 2022-03-09 MED ORDER — PANTOPRAZOLE SODIUM 40 MG PO TBEC
40.0000 mg | DELAYED_RELEASE_TABLET | Freq: Every day | ORAL | 3 refills | Status: DC
  Filled 2022-03-09: qty 90, 90d supply, fill #0
  Filled 2022-06-07: qty 90, 90d supply, fill #1
  Filled 2022-09-05: qty 90, 90d supply, fill #2
  Filled 2022-12-07 (×2): qty 90, 90d supply, fill #3

## 2022-06-07 ENCOUNTER — Other Ambulatory Visit (HOSPITAL_COMMUNITY): Payer: Self-pay

## 2022-06-07 MED ORDER — CARVEDILOL 25 MG PO TABS
25.0000 mg | ORAL_TABLET | Freq: Two times a day (BID) | ORAL | 3 refills | Status: DC
  Filled 2022-06-07: qty 180, 90d supply, fill #0
  Filled 2022-09-05: qty 180, 90d supply, fill #1
  Filled 2022-12-07 (×2): qty 180, 90d supply, fill #2
  Filled 2023-02-28: qty 180, 90d supply, fill #3

## 2022-06-08 ENCOUNTER — Other Ambulatory Visit (HOSPITAL_COMMUNITY): Payer: Self-pay

## 2022-06-08 ENCOUNTER — Other Ambulatory Visit: Payer: Self-pay

## 2022-09-05 ENCOUNTER — Other Ambulatory Visit (HOSPITAL_COMMUNITY): Payer: Self-pay

## 2022-09-08 ENCOUNTER — Other Ambulatory Visit (HOSPITAL_COMMUNITY): Payer: Self-pay

## 2022-09-08 ENCOUNTER — Other Ambulatory Visit: Payer: Self-pay

## 2022-09-08 MED ORDER — IRBESARTAN 300 MG PO TABS
300.0000 mg | ORAL_TABLET | Freq: Every day | ORAL | 3 refills | Status: DC
  Filled 2022-09-08: qty 90, 90d supply, fill #0
  Filled 2022-12-07 (×2): qty 90, 90d supply, fill #1
  Filled 2023-02-28: qty 90, 90d supply, fill #2
  Filled 2023-05-26: qty 90, 90d supply, fill #3

## 2022-12-07 ENCOUNTER — Other Ambulatory Visit (HOSPITAL_COMMUNITY): Payer: Self-pay

## 2023-02-03 ENCOUNTER — Other Ambulatory Visit: Payer: Self-pay

## 2023-02-03 ENCOUNTER — Other Ambulatory Visit (HOSPITAL_COMMUNITY): Payer: Self-pay

## 2023-02-03 MED ORDER — IRBESARTAN 300 MG PO TABS
300.0000 mg | ORAL_TABLET | Freq: Every day | ORAL | 3 refills | Status: AC
  Filled 2023-02-03: qty 90, 90d supply, fill #0

## 2023-02-03 MED ORDER — CARVEDILOL 25 MG PO TABS
25.0000 mg | ORAL_TABLET | Freq: Two times a day (BID) | ORAL | 3 refills | Status: AC
  Filled 2023-02-03: qty 180, 90d supply, fill #0

## 2023-02-03 MED ORDER — FENOFIBRATE 160 MG PO TABS
160.0000 mg | ORAL_TABLET | Freq: Every day | ORAL | 3 refills | Status: AC
  Filled 2023-02-03 – 2023-02-28 (×2): qty 90, 90d supply, fill #0
  Filled 2023-05-26: qty 90, 90d supply, fill #1
  Filled 2023-08-30: qty 90, 90d supply, fill #2
  Filled 2023-12-04: qty 90, 90d supply, fill #3

## 2023-02-03 MED ORDER — SPIRONOLACTONE 50 MG PO TABS
50.0000 mg | ORAL_TABLET | Freq: Every day | ORAL | 3 refills | Status: AC
  Filled 2023-02-03 – 2023-02-28 (×2): qty 90, 90d supply, fill #0
  Filled 2023-05-26: qty 90, 90d supply, fill #1
  Filled 2023-08-30: qty 90, 90d supply, fill #2
  Filled 2023-12-04: qty 90, 90d supply, fill #3

## 2023-02-03 MED ORDER — PANTOPRAZOLE SODIUM 40 MG PO TBEC
40.0000 mg | DELAYED_RELEASE_TABLET | Freq: Every day | ORAL | 3 refills | Status: DC
  Filled 2023-02-03 – 2023-02-28 (×2): qty 90, 90d supply, fill #0
  Filled 2023-05-26: qty 90, 90d supply, fill #1

## 2023-02-03 MED ORDER — DOXAZOSIN MESYLATE 8 MG PO TABS
ORAL_TABLET | ORAL | 3 refills | Status: DC
  Filled 2023-02-03 – 2023-02-28 (×2): qty 135, 90d supply, fill #0
  Filled 2023-05-26: qty 135, 90d supply, fill #1
  Filled 2023-08-30: qty 135, 90d supply, fill #2
  Filled 2023-12-04: qty 135, 90d supply, fill #3

## 2023-02-28 ENCOUNTER — Other Ambulatory Visit (HOSPITAL_COMMUNITY): Payer: Self-pay

## 2023-03-01 ENCOUNTER — Other Ambulatory Visit (HOSPITAL_COMMUNITY): Payer: Self-pay

## 2023-05-26 ENCOUNTER — Other Ambulatory Visit (HOSPITAL_COMMUNITY): Payer: Self-pay

## 2023-05-29 ENCOUNTER — Other Ambulatory Visit (HOSPITAL_COMMUNITY): Payer: Self-pay

## 2023-08-30 ENCOUNTER — Other Ambulatory Visit (HOSPITAL_COMMUNITY): Payer: Self-pay

## 2023-08-30 MED ORDER — IRBESARTAN 300 MG PO TABS
300.0000 mg | ORAL_TABLET | Freq: Every day | ORAL | 3 refills | Status: AC
  Filled 2023-08-30: qty 90, 90d supply, fill #0
  Filled 2023-12-04: qty 90, 90d supply, fill #1
  Filled 2024-03-04: qty 90, 90d supply, fill #2
  Filled 2024-05-27: qty 90, 90d supply, fill #3

## 2023-08-30 MED ORDER — CARVEDILOL 25 MG PO TABS
25.0000 mg | ORAL_TABLET | Freq: Two times a day (BID) | ORAL | 3 refills | Status: AC
  Filled 2023-08-30: qty 180, 90d supply, fill #0
  Filled 2024-05-27: qty 180, 90d supply, fill #1

## 2023-12-04 ENCOUNTER — Other Ambulatory Visit (HOSPITAL_COMMUNITY): Payer: Self-pay

## 2024-03-04 ENCOUNTER — Other Ambulatory Visit (HOSPITAL_COMMUNITY): Payer: Self-pay

## 2024-03-06 ENCOUNTER — Other Ambulatory Visit (HOSPITAL_COMMUNITY): Payer: Self-pay

## 2024-03-08 ENCOUNTER — Other Ambulatory Visit (HOSPITAL_COMMUNITY): Payer: Self-pay

## 2024-03-08 MED ORDER — SPIRONOLACTONE 50 MG PO TABS
50.0000 mg | ORAL_TABLET | Freq: Every day | ORAL | 3 refills | Status: AC
  Filled 2024-03-08: qty 90, 90d supply, fill #0
  Filled 2024-05-27: qty 90, 90d supply, fill #1

## 2024-03-08 MED ORDER — FENOFIBRATE 160 MG PO TABS
160.0000 mg | ORAL_TABLET | Freq: Every day | ORAL | 3 refills | Status: AC
  Filled 2024-03-08: qty 90, 90d supply, fill #0
  Filled 2024-05-27: qty 90, 90d supply, fill #1

## 2024-05-27 ENCOUNTER — Other Ambulatory Visit (HOSPITAL_COMMUNITY): Payer: Self-pay

## 2024-05-28 ENCOUNTER — Other Ambulatory Visit (HOSPITAL_COMMUNITY): Payer: Self-pay

## 2024-05-29 ENCOUNTER — Other Ambulatory Visit (HOSPITAL_COMMUNITY): Payer: Self-pay

## 2024-05-29 MED ORDER — DOXAZOSIN MESYLATE 8 MG PO TABS
ORAL_TABLET | ORAL | 3 refills | Status: AC
  Filled 2024-05-29: qty 135, 90d supply, fill #0

## 2024-05-30 ENCOUNTER — Other Ambulatory Visit: Payer: Self-pay
